# Patient Record
Sex: Female | Born: 1970 | Race: Black or African American | Hispanic: No | Marital: Single | Smoking: Never smoker
Health system: Southern US, Community
[De-identification: ages and names within clinical notes are randomized; demographics above are authoritative.]

## PROBLEM LIST (undated history)

## (undated) DIAGNOSIS — E119 Type 2 diabetes mellitus without complications: Secondary | ICD-10-CM

## (undated) DIAGNOSIS — I10 Essential (primary) hypertension: Secondary | ICD-10-CM

## (undated) DIAGNOSIS — R011 Cardiac murmur, unspecified: Secondary | ICD-10-CM

## (undated) DIAGNOSIS — E669 Obesity, unspecified: Secondary | ICD-10-CM

## (undated) DIAGNOSIS — I214 Non-ST elevation (NSTEMI) myocardial infarction: Principal | ICD-10-CM

## (undated) DIAGNOSIS — G43909 Migraine, unspecified, not intractable, without status migrainosus: Secondary | ICD-10-CM

## (undated) DIAGNOSIS — J45909 Unspecified asthma, uncomplicated: Secondary | ICD-10-CM

## (undated) HISTORY — DX: Non-ST elevation (NSTEMI) myocardial infarction: I21.4

## (undated) HISTORY — DX: Obesity, unspecified: E66.9

## (undated) HISTORY — PX: TUBAL LIGATION: SHX77

## (undated) HISTORY — DX: Migraine, unspecified, not intractable, without status migrainosus: G43.909

---
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
## 1990-03-13 HISTORY — PX: OTHER SURGICAL HISTORY: SHX169

## 2007-03-14 HISTORY — PX: ABSCESS DRAINAGE: SHX1119

## 2007-03-14 HISTORY — PX: ABDOMINAL HYSTERECTOMY: SHX81

## 2012-07-04 ENCOUNTER — Emergency Department (HOSPITAL_COMMUNITY)
Admission: EM | Admit: 2012-07-04 | Discharge: 2012-07-04 | Disposition: A | Discharge status: Home / Self Care | Payer: Self-pay | Attending: Emergency Medicine | Admitting: Emergency Medicine

## 2012-07-04 ENCOUNTER — Encounter (HOSPITAL_COMMUNITY): Payer: Self-pay | Admitting: *Deleted

## 2012-07-04 DIAGNOSIS — J45909 Unspecified asthma, uncomplicated: Secondary | ICD-10-CM | POA: Insufficient documentation

## 2012-07-04 DIAGNOSIS — Z79899 Other long term (current) drug therapy: Secondary | ICD-10-CM | POA: Insufficient documentation

## 2012-07-04 DIAGNOSIS — R209 Unspecified disturbances of skin sensation: Secondary | ICD-10-CM | POA: Insufficient documentation

## 2012-07-04 DIAGNOSIS — I10 Essential (primary) hypertension: Secondary | ICD-10-CM | POA: Insufficient documentation

## 2012-07-04 DIAGNOSIS — M5410 Radiculopathy, site unspecified: Secondary | ICD-10-CM

## 2012-07-04 DIAGNOSIS — Z7982 Long term (current) use of aspirin: Secondary | ICD-10-CM | POA: Insufficient documentation

## 2012-07-04 DIAGNOSIS — IMO0002 Reserved for concepts with insufficient information to code with codable children: Secondary | ICD-10-CM | POA: Insufficient documentation

## 2012-07-04 DIAGNOSIS — E119 Type 2 diabetes mellitus without complications: Secondary | ICD-10-CM | POA: Insufficient documentation

## 2012-07-04 HISTORY — DX: Type 2 diabetes mellitus without complications: E11.9

## 2012-07-04 HISTORY — DX: Unspecified asthma, uncomplicated: J45.909

## 2012-07-04 HISTORY — DX: Essential (primary) hypertension: I10

## 2012-07-04 MED ORDER — DIAZEPAM 5 MG PO TABS: 5.0000 mg | ORAL_TABLET | Freq: Three times a day (TID) | ORAL | Status: DC | PRN

## 2012-07-04 MED ORDER — HYDROCODONE-ACETAMINOPHEN 5-325 MG PO TABS: 1.0000 | ORAL_TABLET | ORAL | Status: DC | PRN

## 2012-07-04 MED ORDER — HYDROCODONE-ACETAMINOPHEN 5-325 MG PO TABS
1.0000 | ORAL_TABLET | Freq: Once | ORAL | Status: AC
  Administered 2012-07-04: 1 via ORAL
  Filled 2012-07-04: qty 1

## 2012-07-04 NOTE — ED Notes (Signed)
The pt has had lower back pain for 2 weeks.  No known injury. No previous history

## 2012-07-04 NOTE — ED Provider Notes (Signed)
History    This chart was scribed for non-physician practitioner working with Carleene Cooper III, MD by Donne Anon, ED Scribe. This patient was seen in room TR05C/TR05C and the patient's care was started at 1933.   CSN: [id number]  Arrival date & time 07/04/12  1836   First MD Initiated Contact with Patient 07/04/12 1933      Chief Complaint  Patient presents with  . Back Pain     The history is provided by the patient. No language interpreter was used.   Margaret Meyers is a 42 y.o. female who presents to the Emergency Department complaining of gradual onset, constant, gradually worsening lower back pain described as aching which radiates into her buttock and left leg and began 2 weeks ago. She denies any recent injury to the area, falls, or MVCs. Pt reports associated occasional numbness. She states the pain is worse with movement and certain motions. She states she feels if someone pulls and stretches her leg she thinks it will relieve the pain. She has tried robaxin once with no relief.  Pt denies weakness, vaginal discharge, dysuria, bowel or bladder, incontinence, hematuria, abdominal pain, fever, chills, body aches or any other pain.   She has had a hysterectomy. She denies IV drug use. No hx cancer. She does not have a PCP.   Past Medical History  Diagnosis Date  . Diabetes mellitus without complication   . Hypertension   . Asthma     History reviewed. No pertinent past surgical history.  No family history on file.  History  Substance Use Topics  . Smoking status: Never Smoker   . Smokeless tobacco: Not on file  . Alcohol Use: Yes     Review of Systems  Constitutional: Negative for fever and chills.  Gastrointestinal: Negative for nausea, vomiting, abdominal pain and diarrhea.  Genitourinary: Negative for dysuria, hematuria, vaginal bleeding, vaginal discharge and vaginal pain.  Musculoskeletal: Positive for back pain.  Neurological: Positive for numbness.  All  other systems reviewed and are negative.    Allergies  Penicillins  Home Medications   Current Outpatient Rx  Name  Route  Sig  Dispense  Refill  . albuterol (PROVENTIL HFA;VENTOLIN HFA) 108 (90 BASE) MCG/ACT inhaler   Inhalation   Inhale 2 puffs into the lungs every 6 (six) hours as needed for wheezing. FOR WHEEZING         . amLODipine (NORVASC) 5 MG tablet   Oral   Take 5 mg by mouth daily.         Marland Kitchen aspirin 325 MG tablet   Oral   Take 325 mg by mouth daily.         . beclomethasone (QVAR) 40 MCG/ACT inhaler   Inhalation   Inhale 1 puff into the lungs 2 (two) times daily.         . cyclobenzaprine (FLEXERIL) 10 MG tablet   Oral   Take 10 mg by mouth daily as needed for muscle spasms. For pain         . hydrochlorothiazide (HYDRODIURIL) 12.5 MG tablet   Oral   Take 12.5 mg by mouth daily.         Marland Kitchen lisinopril (PRINIVIL,ZESTRIL) 20 MG tablet   Oral   Take 20 mg by mouth daily.         . metFORMIN (GLUCOPHAGE) 500 MG tablet   Oral   Take 500 mg by mouth 4 (four) times daily.         Marland Kitchen  metoprolol tartrate (LOPRESSOR) 25 MG tablet   Oral   Take 25 mg by mouth daily.           BP 161/99  Pulse 109  Temp(Src) 97.8 F (36.6 C) (Oral)  Resp 16  SpO2 99%  Physical Exam  Nursing note and vitals reviewed. Constitutional: She appears well-developed and well-nourished. No distress.  HENT:  Head: Normocephalic and atraumatic.  Neck: Neck supple.  Pulmonary/Chest: Effort normal.  Abdominal: Soft. She exhibits no distension. There is no tenderness. There is no rebound and no guarding.  Musculoskeletal: Normal range of motion. She exhibits no edema.       Back:  No tenderness, crepitance or step offs along spine.   Lower extremities:  Strength 5/5, sensation intact, distal pulses intact.       Neurological: She is alert.  Skin: She is not diaphoretic.    ED Course  Procedures (including critical care time) DIAGNOSTIC STUDIES: Oxygen  Saturation is 99% on room air, normal by my interpretation.    COORDINATION OF CARE: 8:23 PM Discussed treatment plan which includes medication with pt at bedside and pt agreed to plan. Will provide with a PCP resource guide.    Labs Reviewed - No data to display No results found.   1. Radicular low back pain     MDM  Pt with two weeks of left lower back pain with radiation into lower leg.  Neurovascularly intact.  No red flags for back pain.  No injury or fall - no need for imaging at this time.  Pt d/c home with short course of valium, norco, PCP follow up.  Discussed all results with patient.  Pt given return precautions.  Pt verbalizes understanding and agrees with plan.      I personally performed the services described in this documentation, which was scribed in my presence. The recorded information has been reviewed and is accurate.        Trixie Dredge, PA-C 07/04/12 2044

## 2012-07-05 NOTE — ED Provider Notes (Signed)
Medical screening examination/treatment/procedure(s) were performed by non-physician practitioner and as supervising physician I was immediately available for consultation/collaboration.   Carleene Cooper III, MD 07/05/12 [phone]

## 2012-11-29 ENCOUNTER — Emergency Department (HOSPITAL_COMMUNITY): Payer: Self-pay

## 2012-11-29 ENCOUNTER — Emergency Department (HOSPITAL_COMMUNITY)
Admission: EM | Admit: 2012-11-29 | Discharge: 2012-11-29 | Disposition: A | Discharge status: Home / Self Care | Payer: Self-pay | Attending: Emergency Medicine | Admitting: Emergency Medicine

## 2012-11-29 ENCOUNTER — Encounter (HOSPITAL_COMMUNITY): Payer: Self-pay | Admitting: *Deleted

## 2012-11-29 DIAGNOSIS — Z7982 Long term (current) use of aspirin: Secondary | ICD-10-CM | POA: Insufficient documentation

## 2012-11-29 DIAGNOSIS — R079 Chest pain, unspecified: Secondary | ICD-10-CM

## 2012-11-29 DIAGNOSIS — R61 Generalized hyperhidrosis: Secondary | ICD-10-CM | POA: Insufficient documentation

## 2012-11-29 DIAGNOSIS — Z880 Allergy status to penicillin: Secondary | ICD-10-CM | POA: Insufficient documentation

## 2012-11-29 DIAGNOSIS — IMO0002 Reserved for concepts with insufficient information to code with codable children: Secondary | ICD-10-CM | POA: Insufficient documentation

## 2012-11-29 DIAGNOSIS — Z79899 Other long term (current) drug therapy: Secondary | ICD-10-CM | POA: Insufficient documentation

## 2012-11-29 DIAGNOSIS — E119 Type 2 diabetes mellitus without complications: Secondary | ICD-10-CM | POA: Insufficient documentation

## 2012-11-29 DIAGNOSIS — R071 Chest pain on breathing: Secondary | ICD-10-CM | POA: Insufficient documentation

## 2012-11-29 DIAGNOSIS — J45901 Unspecified asthma with (acute) exacerbation: Secondary | ICD-10-CM | POA: Insufficient documentation

## 2012-11-29 DIAGNOSIS — E669 Obesity, unspecified: Secondary | ICD-10-CM | POA: Insufficient documentation

## 2012-11-29 DIAGNOSIS — R51 Headache: Secondary | ICD-10-CM | POA: Insufficient documentation

## 2012-11-29 DIAGNOSIS — I10 Essential (primary) hypertension: Secondary | ICD-10-CM | POA: Insufficient documentation

## 2012-11-29 DIAGNOSIS — R011 Cardiac murmur, unspecified: Secondary | ICD-10-CM | POA: Insufficient documentation

## 2012-11-29 HISTORY — DX: Cardiac murmur, unspecified: R01.1

## 2012-11-29 LAB — CBC WITH DIFFERENTIAL/PLATELET
Basophils Absolute: 0 10*3/uL (ref 0.0–0.1)
Basophils Relative: 0 % (ref 0–1)
Eosinophils Absolute: 0 10*3/uL (ref 0.0–0.7)
MCH: 26.8 pg (ref 26.0–34.0)
MCHC: 32.6 g/dL (ref 30.0–36.0)
Monocytes Relative: 1 % — ABNORMAL LOW (ref 3–12)
Neutro Abs: 8.3 10*3/uL — ABNORMAL HIGH (ref 1.7–7.7)
Neutrophils Relative %: 85 % — ABNORMAL HIGH (ref 43–77)
Platelets: 264 10*3/uL (ref 150–400)
RDW: 14.6 % (ref 11.5–15.5)

## 2012-11-29 LAB — BASIC METABOLIC PANEL
BUN: 17 mg/dL (ref 6–23)
Calcium: 9.2 mg/dL (ref 8.4–10.5)
Creatinine, Ser: 0.79 mg/dL (ref 0.50–1.10)
GFR calc Af Amer: >90 mL/min (ref >90)
GFR calc non Af Amer: >90 mL/min (ref >90)
Glucose, Bld: 495 mg/dL — ABNORMAL HIGH (ref 70–99)
Potassium: 4.5 mEq/L (ref 3.5–5.1)

## 2012-11-29 LAB — TROPONIN I: Troponin I: <0.3 ng/mL (ref <0.30)

## 2012-11-29 NOTE — ED Notes (Signed)
Pt given update on labs and x-rays

## 2012-11-29 NOTE — Discharge Instructions (Signed)
Chest Pain (Nonspecific) Chest pain has many causes. Your pain could be caused by something serious, such as a heart attack or a blood clot in the lungs. It could also be caused by something less serious, such as a chest bruise or a virus. Follow up with your doctor. More lab tests or other studies may be needed to find the cause of your pain. Most of the time, nonspecific chest pain will improve within 2 to 3 days of rest and mild pain medicine. HOME CARE  For chest bruises, you may put ice on the sore area for 15-20 minutes, 3-4 times a day. Do this only if it makes you or your child feel better.  Put ice in a plastic bag.  Place a towel between the skin and the bag.  Rest for the next 2 to 3 days.  Go back to work if the pain improves.  See your doctor if the pain lasts longer than 1 to 2 weeks.  Only take medicine as told by your doctor.  Quit smoking if you smoke. GET HELP RIGHT AWAY IF:   There is more pain or pain that spreads to the arm, neck, jaw, back, or belly (abdomen).  You or your child has shortness of breath.  You or your child coughs more than usual or coughs up blood.  You or your child has very bad back or belly pain, feels sick to his or her stomach (nauseous), or throws up (vomits).  You or your child has very bad weakness.  You or your child passes out (faints).  You or your child has a temperature by mouth above 102 F (38.9 C), not controlled by medicine. Any of these problems may be serious and may be an emergency. Do not wait to see if the problems will go away. Get medical help right away. Call your local emergency services 911 in [place] Do not drive yourself to the hospital. MAKE SURE YOU:   Understand these instructions.  Will watch this condition.  Will get help right away if you or your child is not doing well or gets worse. Document Released: 08/16/2007 Document Revised: 05/22/2011 Document Reviewed: 08/16/2007 [facility] Patient Information  2014 [place], [place].   RESOURCE GUIDE  Chronic Pain Problems: Contact [facility]  [phone] Patients need to be referred by their primary care doctor.  Insufficient Money for Medicine: Contact United Way:  call [phone]  No Primary Care Doctor: - Call Health Connect  [phone] - can help you locate a primary care doctor that  accepts your insurance, provides certain services, etc. - Physician Referral [phone]  Agencies that provide inexpensive medical care: - [facility]  [phone] - [facility]  [phone] - [facility]  [phone] - Women's Clinic  [phone] - Planned Parenthood  [phone] [facility]  [phone]  Medicaid-accepting [facility] Providers: - [facility]- [address]  [phone], Mon-Fri 9am-7pm, Sat 9am-1pm - [facility]- [address] [place], [address] [place]  [phone] - [facility]- [address] [place]  [phone] [facility]- [address]  [phone] - Renaye Rakers- [address] [place], [address], [phone]  Only accepts [place] patients after they have their name  applied to their card  Self Pay (no insurance) in [place]: - Sickle Cell Patients - [facility]  [address]  [place], [phone] - [facility]- [address] [place]  [phone]       Patrcia Dolly [facility] [place]- [address]       -     [facility]- see information above (Speak to [employer] if you do not have insurance)       -  [facility]- [address],  [phone]       -  [facility]- [address] [phone]       -  Dr Julio Sicks-  [address], [place], [phone]       -  [facility] - [address] [phone]       -  [facility]- [address] [phone], also [address] [phone]       -     [facility]- [address] [place], [phone], 1st & 3rd Saturday         every month, 10am-1pm  -     [facility]   [address] [place], [place].   Phone:  [phone], Fax:  [phone]. Hours of Operation:  9 am - 6 pm, M-F.  -     [facility]   [address], [place]   Phone: [phone], Fax: [phone]. Hours of Operation:  8:30 am - 5:30 pm, M-F.  [facility] [address] [place], [place] [address] [phone]  [facility] [address] [place], [place] [address] [phone]  1) Find a Doctor and Pay Out of Pocket Although you won't have to find out who is covered by your insurance plan, it is a good idea to ask around and get recommendations. You will then need to call the office and see if the doctor you have chosen will accept you as a new patient and what types of options they offer for patients who are self-pay. Some doctors offer discounts or will set up payment plans for their patients who do not have insurance, but you will need to ask so you aren't surprised when you get to your appointment.  2) Contact Your Local Health Department Not all health departments have doctors that can see patients for sick visits, but many do, so it is worth a call to see if yours does. If you don't know where your local health department is, you can check in your phone book. The CDC also has a tool to help you locate your state's health department, and many state websites also have listings of all of their local health departments.  3) Find a Walk-in Clinic If your illness is not likely to be very severe or complicated, you may want to try a walk in clinic. These are popping up all over the country in pharmacies, drugstores, and shopping centers. They're usually staffed by nurse practitioners or physician  assistants that have been trained to treat common illnesses and complaints. They're usually fairly quick and inexpensive. However, if you have serious medical issues or chronic medical problems, these are probably not your best option  STD Testing - [facility] Department of [facility] [place], STD Clinic, [address] [place], phone [phone] or [phone].  Monday - Friday, call for an appointment. [facility] of [employer], STD Clinic,  [address], [place], phone [phone] or [phone].  Monday - Friday, call for an appointment.  Abuse/Neglect: [facility] Child Abuse Hotline 716-415-9620 [facility] Child Abuse Hotline [phone] (After Hours)  Emergency Shelter:  [facility] [phone]  Maternity Homes: - Room at the [place] [facility] [phone] - [facility] [phone]  MRSA Hotline #:   9386632106  Dental Assistance If unable to pay or uninsured, contact:  [facility]. to become qualified for the adult dental clinic.  Patients with Medicaid: [facility] [phone] [address], [phone] [address], [phone]  If unable to pay, or uninsured, contact [facility] [phone] in [place], [phone] in [facility]) to become qualified for the adult dental clinic  [facility] [address] [place], [place] [address] [phone] [email or web address]  Other Proofreader Services: - [facility]- [address] [place], [place], [place], [address], [phone], Ext. 123, 2nd and 4th Thursday of the month at 6:30am.  10 clients each day by appointment, can sometimes see walk-in patients if someone does not show for an appointment. [facility]- [address] [place], [place], [address], [phone] - [facility] [address] [place], [place],  [address], [phone] - [facility]- [phone] [facility] Health Department- [phone] [facility] Health [phone]       Behavioral Health Resources in the [facility]  Intensive Outpatient Programs: [facility]      [address] [place], [place] [phone] Both a day and evening program       [facility]     [address]        [place], [place] [address] [phone]         ADS: [facility] [address] [place] [phone]  [facility] Mental Health ACCESS LINE: ([phone] or [phone] [address] [place], [place] [address] [email or web address]   Substance Abuse Resources: - [facility] Drug [facility]  ([phone] - [facility] [phone] - [facility] [phone] Floydene Flock [phone] - Residential & Outpatient Substance Abuse Program  [phone]  Psychological Services: [facility]  [phone] [facility]  [phone] - [facility], [phone] [place]. [address], [place], ACCESS LINE: [phone]) [phone] or 4310857381, [email or web address]  Mobile Crisis Teams:                                        [facility]rapeutic Alternatives         Mobile Crisis Care Unit [phone]             [facility] [address] Ginette Otto [phone]                                         Interventionist [address] [place] [address] [place] [phone]  Self-Help/Support Groups: Mental Health Assoc. of [employer] of support groups 765-677-9170 (call for more info)  Narcotics Anonymous (NA) Caring Services [address] [place] - 2 meetings at this location  Residential [employer]:  [facility] Residential Treatment      [phone] [employer]  [place]  [place]       [phone]         [facility] [address], [place] [address] [place], [place]  [address] [phone]  [facility]  [address] [place], [place] [address] [phone] Admissions: 8am-3pm M-F  [facility]     [address]        [place], [place] [address]       [phone]         [facility] [address] Starling Manns [place], [place] [phone]  [facility] [address] [place], [place] [phone]  [facility] - Intensive Outpatient      [address]     [place], [place]       [phone]         [facility] (Addiction Recovery Care [facility].)     [address] [place], [place] [phone] or [phone]  Residential Treatment Services (RTS), Medicaid [address] [place], [place] [phone]  [facility]ellowship [address]                                               [address] [place] [phone]  [facility] Resources: [phone]               General Therapy                                                Angie Fava, PhD        [address] [place], [place] [address]         [phone]   Insurance  [facility] Behavioral   [address] [place], [place] [address] [phone]  [facility] Recovery [address] [place], [place] [address] [phone] Insurance/Medicaid/sponsorship through [facility] and Families                                              [address]                                        [place], [place] [address]    Therapy/tele-psych/case         [phone]          [facility] [address], [place]  [address]  Adolescent/group home/case management [phone]                                           Creola Corn PhD       General therapy       Insurance   [phone]  Dr. Lolly Mustache, Insurance, M-F [phone]  [facility]  [employer] [facility] Dept. [address]                 [address]         [address] [place] [address]  [place]ondell Reveal Phone:  [phone]                                  Phone:  [phone]                   Phone:  [phone]  [facility], [phone] - [facility] - [facility]- [phone]       -     [facility] in [place], [address]             [phone], Insurance  [place] Child Abuse Hotline [phone] or [phone] (After Hours)   You will need a primary care doctor and an endocrinologist for your diabetes Your glucose is elevated today.  Return if worse

## 2012-11-29 NOTE — ED Notes (Addendum)
Chest pain , onset this am and headache.   Sob.  Increased pain with inspiration

## 2012-11-29 NOTE — ED Provider Notes (Signed)
CSN: [id number]     Arrival date & time 11/29/12  1529 History  This chart was scribed for Donnetta Hutching, MD by Shari Heritage, ED Scribe. The patient was seen in room APA15/APA15. Patient's care was started at 4:08 PM.     Chief Complaint  Patient presents with  . Chest Pain    The history is provided by the patient. No language interpreter was used.    HPI Comments: Margaret Meyers is a 42 y.o. female with history of heart murmur, cardiomegaly, DM, HTN and asthma who presents to the Emergency Department complaining of constant, moderate anterior central chest pain onset 6 hours ago. She describes pain as "pulling." She states that pain began while she was sitting down at work. There is associated diaphoresis, shortness of breath, and severe headache. She states that she checked her blood sugar after her symptoms began and it was 478. She denies any other symptoms at this time. She does not smoke cigarettes.   Past Medical History  Diagnosis Date  . Diabetes mellitus without complication   . Hypertension   . Asthma   . Heart murmur    Past Surgical History  Procedure Laterality Date  . Tubal ligation    . Abdominal hysterectomy     History reviewed. No pertinent family history. History  Substance Use Topics  . Smoking status: Never Smoker   . Smokeless tobacco: Not on file  . Alcohol Use: Yes   OB History   Grav Para Term Preterm Abortions TAB SAB Ect Mult Living                 Review of Systems A complete 10 system review of systems was obtained and all systems are negative except as noted in the HPI and PMH.   Allergies  Penicillins  Home Medications   Current Outpatient Rx  Name  Route  Sig  Dispense  Refill  . albuterol (PROVENTIL HFA;VENTOLIN HFA) 108 (90 BASE) MCG/ACT inhaler   Inhalation   Inhale 2 puffs into the lungs every 6 (six) hours as needed for wheezing. FOR WHEEZING         . amLODipine (NORVASC) 5 MG tablet   Oral   Take 5 mg by mouth daily.          Marland Kitchen aspirin 325 MG tablet   Oral   Take 325 mg by mouth daily.         . beclomethasone (QVAR) 40 MCG/ACT inhaler   Inhalation   Inhale 1 puff into the lungs 2 (two) times daily.         . cyclobenzaprine (FLEXERIL) 10 MG tablet   Oral   Take 10 mg by mouth daily as needed for muscle spasms. For pain         . diazepam (VALIUM) 5 MG tablet   Oral   Take 1 tablet (5 mg total) by mouth every 8 (eight) hours as needed (muscle spasm or pain).   10 tablet   0   . hydrochlorothiazide (HYDRODIURIL) 12.5 MG tablet   Oral   Take 12.5 mg by mouth daily.         Marland Kitchen HYDROcodone-acetaminophen (NORCO/VICODIN) 5-325 MG per tablet   Oral   Take 1 tablet by mouth every 4 (four) hours as needed for pain.   10 tablet   0   . lisinopril (PRINIVIL,ZESTRIL) 20 MG tablet   Oral   Take 20 mg by mouth daily.         Marland Kitchen  metFORMIN (GLUCOPHAGE) 500 MG tablet   Oral   Take 500 mg by mouth 4 (four) times daily.         . metoprolol tartrate (LOPRESSOR) 25 MG tablet   Oral   Take 25 mg by mouth daily.          Triage Vitals: BP 153/110  Pulse 111  Temp(Src) 98.4 F (36.9 C) (Oral)  Resp 20  Ht 4\' 8"  (1.422 m)  Wt 210 lb (95.255 kg)  BMI 47.11 kg/m2  SpO2 97% Physical Exam  Nursing note and vitals reviewed. Constitutional: She is oriented to person, place, and time. She appears well-developed and well-nourished.  Obese.  HENT:  Head: Normocephalic and atraumatic.  Eyes: Conjunctivae and EOM are normal. Pupils are equal, round, and reactive to light.  Neck: Normal range of motion. Neck supple.  Cardiovascular: Normal rate, regular rhythm and normal heart sounds.   Pulmonary/Chest: Effort normal and breath sounds normal.  Mild tenderness to left anterior chest wall.  Abdominal: Soft. Bowel sounds are normal.  Musculoskeletal: Normal range of motion.  Neurological: She is alert and oriented to person, place, and time.  Skin: Skin is warm and dry.  Psychiatric: She has a  normal mood and affect.    ED Course  Procedures (including critical care time) DIAGNOSTIC STUDIES: Oxygen Saturation is 97% on room air, adequate by my interpretation.    COORDINATION OF CARE: 4:10 PM-Will administer nitroglycerin, and order EKG, CBC with diff, troponin and BMP. Patient informed of current plan for treatment and evaluation and agrees with plan at this time.     Labs Review Labs Reviewed  BASIC METABOLIC PANEL - Abnormal; Notable for the following:    Sodium 131 (*)    Chloride 93 (*)    Glucose, Bld 495 (*)    All other components within normal limits  CBC WITH DIFFERENTIAL - Abnormal; Notable for the following:    Neutrophils Relative % 85 (*)    Neutro Abs 8.3 (*)    Monocytes Relative 1 (*)    All other components within normal limits  TROPONIN I    Imaging Review Dg Chest 2 View  11/29/2012   CLINICAL DATA:  Chest pain, shortness of breath  EXAM: CHEST  2 VIEW  COMPARISON:  None.  FINDINGS: Lungs are clear. No pleural effusion or pneumothorax.  Mild cardiomegaly.  Visualized osseous structures are within normal limits.  IMPRESSION: No evidence of acute cardiopulmonary disease.   Electronically Signed   By: Charline Bills M.D.   On: 11/29/2012 16:52     Date: 11/29/2012  Rate: 101  Rhythm: sinus tachy  QRS Axis: normal  Intervals: normal  ST/T Wave abnormalities: normal  Conduction Disutrbances: none  Narrative Interpretation: unremarkable    MDM  No diagnosis found. Pain.at discharge. Troponin and EKG negative.  Glucose noted to be elevated. Not in DKA  I personally performed the services described in this documentation, which was scribed in my presence. The recorded information has been reviewed and is accurate.    Donnetta Hutching, MD 11/29/12 2012

## 2013-01-08 ENCOUNTER — Encounter (HOSPITAL_COMMUNITY): Payer: Self-pay | Admitting: Emergency Medicine

## 2013-01-08 ENCOUNTER — Emergency Department (HOSPITAL_COMMUNITY)
Admission: EM | Admit: 2013-01-08 | Discharge: 2013-01-08 | Disposition: A | Discharge status: Home / Self Care | Payer: Self-pay | Attending: Emergency Medicine | Admitting: Emergency Medicine

## 2013-01-08 DIAGNOSIS — J029 Acute pharyngitis, unspecified: Secondary | ICD-10-CM | POA: Insufficient documentation

## 2013-01-08 DIAGNOSIS — I10 Essential (primary) hypertension: Secondary | ICD-10-CM | POA: Insufficient documentation

## 2013-01-08 DIAGNOSIS — Z7982 Long term (current) use of aspirin: Secondary | ICD-10-CM | POA: Insufficient documentation

## 2013-01-08 DIAGNOSIS — Z79899 Other long term (current) drug therapy: Secondary | ICD-10-CM | POA: Insufficient documentation

## 2013-01-08 DIAGNOSIS — Z880 Allergy status to penicillin: Secondary | ICD-10-CM | POA: Insufficient documentation

## 2013-01-08 DIAGNOSIS — J45909 Unspecified asthma, uncomplicated: Secondary | ICD-10-CM | POA: Insufficient documentation

## 2013-01-08 DIAGNOSIS — R011 Cardiac murmur, unspecified: Secondary | ICD-10-CM | POA: Insufficient documentation

## 2013-01-08 DIAGNOSIS — J069 Acute upper respiratory infection, unspecified: Secondary | ICD-10-CM | POA: Insufficient documentation

## 2013-01-08 DIAGNOSIS — L293 Anogenital pruritus, unspecified: Secondary | ICD-10-CM | POA: Insufficient documentation

## 2013-01-08 DIAGNOSIS — E119 Type 2 diabetes mellitus without complications: Secondary | ICD-10-CM | POA: Insufficient documentation

## 2013-01-08 LAB — CBC WITH DIFFERENTIAL/PLATELET
Basophils Absolute: 0 10*3/uL (ref 0.0–0.1)
Basophils Relative: 0 % (ref 0–1)
Eosinophils Absolute: 0.1 10*3/uL (ref 0.0–0.7)
Eosinophils Relative: 1 % (ref 0–5)
HCT: 39.2 % (ref 36.0–46.0)
Hemoglobin: 13 g/dL (ref 12.0–15.0)
Lymphocytes Relative: 26 % (ref 12–46)
Lymphs Abs: 2.2 10*3/uL (ref 0.7–4.0)
MCH: 27.3 pg (ref 26.0–34.0)
MCHC: 33.2 g/dL (ref 30.0–36.0)
MCV: 82.4 fL (ref 78.0–100.0)
Monocytes Absolute: 0.3 K/uL (ref 0.1–1.0)
Monocytes Relative: 3 % (ref 3–12)
Neutro Abs: 6.2 K/uL (ref 1.7–7.7)
Neutrophils Relative %: 70 % (ref 43–77)
Platelets: 231 10*3/uL (ref 150–400)
RBC: 4.76 MIL/uL (ref 3.87–5.11)
RDW: 14.5 % (ref 11.5–15.5)
WBC: 8.8 10*3/uL (ref 4.0–10.5)

## 2013-01-08 LAB — URINALYSIS, ROUTINE W REFLEX MICROSCOPIC
Bilirubin Urine: NEGATIVE
Glucose, UA: >1000 mg/dL — AB
Hgb urine dipstick: NEGATIVE
Ketones, ur: 15 mg/dL — AB
Leukocytes, UA: NEGATIVE
Nitrite: NEGATIVE
Protein, ur: NEGATIVE mg/dL
Specific Gravity, Urine: 1.036 — ABNORMAL HIGH (ref 1.005–1.030)
Urobilinogen, UA: 1 mg/dL (ref 0.0–1.0)
pH: 6 (ref 5.0–8.0)

## 2013-01-08 LAB — COMPREHENSIVE METABOLIC PANEL WITH GFR
Albumin: 3.2 g/dL — ABNORMAL LOW (ref 3.5–5.2)
BUN: 11 mg/dL (ref 6–23)
Chloride: 97 meq/L (ref 96–112)
Creatinine, Ser: 0.78 mg/dL (ref 0.50–1.10)
GFR calc Af Amer: >90 mL/min (ref >90)
GFR calc non Af Amer: >90 mL/min (ref >90)
Glucose, Bld: 293 mg/dL — ABNORMAL HIGH (ref 70–99)
Total Bilirubin: 0.4 mg/dL (ref 0.3–1.2)

## 2013-01-08 LAB — WET PREP, GENITAL
Clue Cells Wet Prep HPF POC: NONE SEEN
Trich, Wet Prep: NONE SEEN
WBC, Wet Prep HPF POC: NONE SEEN
Yeast Wet Prep HPF POC: NONE SEEN

## 2013-01-08 LAB — COMPREHENSIVE METABOLIC PANEL
ALT: 23 U/L (ref 0–35)
AST: 21 U/L (ref 0–37)
Alkaline Phosphatase: 93 U/L (ref 39–117)
CO2: 26 mEq/L (ref 19–32)
Calcium: 8.5 mg/dL (ref 8.4–10.5)
Potassium: 3.9 mEq/L (ref 3.5–5.1)
Sodium: 134 mEq/L — ABNORMAL LOW (ref 135–145)
Total Protein: 6.8 g/dL (ref 6.0–8.3)

## 2013-01-08 LAB — URINE MICROSCOPIC-ADD ON

## 2013-01-08 MED ORDER — BENZONATATE 100 MG PO CAPS: 100.0000 mg | ORAL_CAPSULE | Freq: Three times a day (TID) | ORAL | Status: DC

## 2013-01-08 MED ORDER — LISINOPRIL 20 MG PO TABS: 20.0000 mg | ORAL_TABLET | Freq: Every day | ORAL | Status: DC

## 2013-01-08 MED ORDER — GLYBURIDE 5 MG PO TABS: 5.0000 mg | ORAL_TABLET | Freq: Every day | ORAL | Status: DC

## 2013-01-08 MED ORDER — METFORMIN HCL 500 MG PO TABS: 1000.0000 mg | ORAL_TABLET | Freq: Two times a day (BID) | ORAL | Status: DC

## 2013-01-08 MED ORDER — METOPROLOL TARTRATE 50 MG PO TABS: 25.0000 mg | ORAL_TABLET | Freq: Two times a day (BID) | ORAL | Status: DC

## 2013-01-08 MED ORDER — HYDROCHLOROTHIAZIDE 25 MG PO TABS: 12.5000 mg | ORAL_TABLET | Freq: Every day | ORAL | Status: DC

## 2013-01-08 MED ORDER — BECLOMETHASONE DIPROPIONATE 40 MCG/ACT IN AERS: 2.0000 | INHALATION_SPRAY | Freq: Two times a day (BID) | RESPIRATORY_TRACT | Status: DC

## 2013-01-08 MED ORDER — FLUCONAZOLE 150 MG PO TABS: 150.0000 mg | ORAL_TABLET | Freq: Once | ORAL | Status: DC

## 2013-01-08 MED ORDER — AMLODIPINE BESYLATE 10 MG PO TABS: 5.0000 mg | ORAL_TABLET | Freq: Every day | ORAL | Status: DC

## 2013-01-08 NOTE — ED Notes (Signed)
Pt reports HA, sore throat, nose bleed at 0200 today, intermittent cough.  Also thinks she has a yeast infection.

## 2013-01-08 NOTE — ED Provider Notes (Signed)
CSN: [id number]     Arrival date & time 01/08/13  1019 History   First MD Initiated Contact with Patient 01/08/13 1153     Chief Complaint  Patient presents with  . Epistaxis   (Consider location/radiation/quality/duration/timing/severity/associated sxs/prior Treatment) HPI Comments: Patient presents with several complaints.  She reports that she has been having nasal congestion, cough, and sore throat for the past 3 days.  Symptoms gradually worsening.  She has not taken anything for symptoms prior to arrival.  She also states that earlier this morning her nose began bleeding, which has occurred intermittently throughout the day.  She denies any epistaxis at this time.  She also is requesting prescriptions for her DM and HTN medications.  She states that she ran out of the medications two weeks ago.  She recently moved from [place] and has not yet established with a PCP.  She is also complaining of some vaginal itching and irritation that has been present for the past week.  She reports that her symptoms are similar to when she has had a yeast infection in the past.  She denies any vaginal discharge.  Denies dysuria, increased urinary urgency, and increased urinary frequency.  No fever or chills.    The history is provided by the patient.    Past Medical History  Diagnosis Date  . Diabetes mellitus without complication   . Hypertension   . Asthma   . Heart murmur    Past Surgical History  Procedure Laterality Date  . Tubal ligation    . Abdominal hysterectomy     No family history on file. History  Substance Use Topics  . Smoking status: Never Smoker   . Smokeless tobacco: Not on file  . Alcohol Use: Yes   OB History   Grav Para Term Preterm Abortions TAB SAB Ect Mult Living                 Review of Systems  Constitutional: Negative for fever and chills.  HENT: Positive for sore throat.   Respiratory: Positive for cough.   Genitourinary:       Vaginal itching and  irritation.  All other systems reviewed and are negative.    Allergies  Penicillins  Home Medications   Current Outpatient Rx  Name  Route  Sig  Dispense  Refill  . albuterol (PROVENTIL HFA;VENTOLIN HFA) 108 (90 BASE) MCG/ACT inhaler   Inhalation   Inhale 2 puffs into the lungs every 6 (six) hours as needed for wheezing. FOR WHEEZING         . amLODipine (NORVASC) 5 MG tablet   Oral   Take 5 mg by mouth every morning.          Marland Kitchen aspirin 325 MG tablet   Oral   Take 325 mg by mouth daily.         . beclomethasone (QVAR) 40 MCG/ACT inhaler   Inhalation   Inhale 1 puff into the lungs 2 (two) times daily.         . flintstones complete (FLINTSTONES) 60 MG chewable tablet   Oral   Chew 2 tablets by mouth every morning.         . glyBURIDE (DIABETA) 5 MG tablet   Oral   Take 5 mg by mouth daily with breakfast.         . hydrochlorothiazide (HYDRODIURIL) 12.5 MG tablet   Oral   Take 12.5 mg by mouth every morning.          Marland Kitchen  lisinopril (PRINIVIL,ZESTRIL) 20 MG tablet   Oral   Take 20 mg by mouth every morning.          . metFORMIN (GLUCOPHAGE) 1000 MG tablet   Oral   Take 1,000 mg by mouth 2 (two) times daily.         . metoprolol tartrate (LOPRESSOR) 25 MG tablet   Oral   Take 25 mg by mouth every morning.           BP 159/97  Pulse 92  Temp(Src) 98.9 F (37.2 C)  Resp 16  SpO2 98% Physical Exam  Nursing note and vitals reviewed. Constitutional: She appears well-developed and well-nourished.  HENT:  Head: Normocephalic and atraumatic.  Right Ear: Tympanic membrane and ear canal normal.  Left Ear: Tympanic membrane and ear canal normal.  Nose: Mucosal edema present. No epistaxis. Right sinus exhibits no maxillary sinus tenderness and no frontal sinus tenderness. Left sinus exhibits no maxillary sinus tenderness and no frontal sinus tenderness.  Mouth/Throat: Uvula is midline, oropharynx is clear and moist and mucous membranes are normal.  No oropharyngeal exudate, posterior oropharyngeal edema or posterior oropharyngeal erythema.  No active epistaxis at this time.  Neck: Normal range of motion. Neck supple.  Cardiovascular: Normal rate, regular rhythm and normal heart sounds.   Pulmonary/Chest: Effort normal and breath sounds normal.  Genitourinary: There is no rash or tenderness on the right labia. There is no rash or tenderness on the left labia.  Thick whitish colored vaginal discharge.    Neurological: She is alert.  Skin: Skin is warm and dry.  Psychiatric: She has a normal mood and affect.    ED Course  Procedures (including critical care time) Labs Review Labs Reviewed  GLUCOSE, CAPILLARY - Abnormal; Notable for the following:    Glucose-Capillary 256 (*)    All other components within normal limits  CBC WITH DIFFERENTIAL  COMPREHENSIVE METABOLIC PANEL  URINALYSIS, ROUTINE W REFLEX MICROSCOPIC   Imaging Review No results found.  EKG Interpretation   None       MDM  No diagnosis found. Patient presents with a chief complaint of nasal congestion, cough, and sore throat for the past 3 days.  Patient afebrile.  Pulse ox 98 on RA.  Lungs CTAB.  Symptoms most consistent with Viral URI.  Patient also complaining of vaginal itching and irritation.  Wet prep negative.  However, patient did have some thick white cottage cheese like discharge of the external vagina on exam.  Will treat for Candida infection with Diflucan.  Patient also reports that she is out of her DM and HTN medications.  Patient given prescriptions and referral to PCP.    Santiago Glad, PA-C 01/08/13 2145

## 2013-01-08 NOTE — ED Notes (Addendum)
Nosebleed since 2 am has had congestion sorethroat  And sever h/a has been out of bp meds and diabetic meds x  2 weeks just moved here she states also has vag itch denies dysuria

## 2013-01-09 LAB — GC/CHLAMYDIA PROBE AMP: CT Probe RNA: NEGATIVE

## 2013-01-14 NOTE — ED Provider Notes (Signed)
Medical screening examination/treatment/procedure(s) were performed by non-physician practitioner and as supervising physician I was immediately available for consultation/collaboration.  EKG Interpretation   None         Gavin Pound. Oletta Lamas, MD 01/14/13 [phone]

## 2014-10-30 ENCOUNTER — Emergency Department (HOSPITAL_COMMUNITY)
Admission: EM | Admit: 2014-10-30 | Discharge: 2014-10-30 | Disposition: A | Discharge status: Home / Self Care | Payer: No Typology Code available for payment source | Attending: Emergency Medicine | Admitting: Emergency Medicine

## 2014-10-30 ENCOUNTER — Encounter (HOSPITAL_COMMUNITY): Payer: Self-pay | Admitting: Emergency Medicine

## 2014-10-30 ENCOUNTER — Emergency Department (HOSPITAL_COMMUNITY): Payer: No Typology Code available for payment source

## 2014-10-30 DIAGNOSIS — K2100 Gastro-esophageal reflux disease with esophagitis, without bleeding: Secondary | ICD-10-CM

## 2014-10-30 DIAGNOSIS — E119 Type 2 diabetes mellitus without complications: Secondary | ICD-10-CM | POA: Insufficient documentation

## 2014-10-30 DIAGNOSIS — R011 Cardiac murmur, unspecified: Secondary | ICD-10-CM | POA: Insufficient documentation

## 2014-10-30 DIAGNOSIS — Z79899 Other long term (current) drug therapy: Secondary | ICD-10-CM | POA: Insufficient documentation

## 2014-10-30 DIAGNOSIS — R079 Chest pain, unspecified: Secondary | ICD-10-CM | POA: Diagnosis present

## 2014-10-30 DIAGNOSIS — R51 Headache: Secondary | ICD-10-CM | POA: Insufficient documentation

## 2014-10-30 DIAGNOSIS — I10 Essential (primary) hypertension: Secondary | ICD-10-CM | POA: Insufficient documentation

## 2014-10-30 DIAGNOSIS — J45909 Unspecified asthma, uncomplicated: Secondary | ICD-10-CM | POA: Diagnosis not present

## 2014-10-30 DIAGNOSIS — Z7982 Long term (current) use of aspirin: Secondary | ICD-10-CM | POA: Diagnosis not present

## 2014-10-30 DIAGNOSIS — Z880 Allergy status to penicillin: Secondary | ICD-10-CM | POA: Insufficient documentation

## 2014-10-30 DIAGNOSIS — Z7951 Long term (current) use of inhaled steroids: Secondary | ICD-10-CM | POA: Insufficient documentation

## 2014-10-30 LAB — BASIC METABOLIC PANEL
Anion gap: 8 (ref 5–15)
BUN: 15 mg/dL (ref 6–20)
CHLORIDE: 100 mmol/L — AB (ref 101–111)
CO2: 31 mmol/L (ref 22–32)
CREATININE: 0.7 mg/dL (ref 0.44–1.00)
Calcium: 8.7 mg/dL — ABNORMAL LOW (ref 8.9–10.3)
Glucose, Bld: 375 mg/dL — ABNORMAL HIGH (ref 65–99)
Potassium: 4.1 mmol/L (ref 3.5–5.1)
SODIUM: 139 mmol/L (ref 135–145)

## 2014-10-30 LAB — HEPATIC FUNCTION PANEL
ALT: 20 U/L (ref 14–54)
AST: 23 U/L (ref 15–41)
Albumin: 3.5 g/dL (ref 3.5–5.0)
Alkaline Phosphatase: 88 U/L (ref 38–126)
BILIRUBIN DIRECT: 0.1 mg/dL (ref 0.1–0.5)
Indirect Bilirubin: 0.3 mg/dL (ref 0.3–0.9)
Total Bilirubin: 0.4 mg/dL (ref 0.3–1.2)
Total Protein: 7.1 g/dL (ref 6.5–8.1)

## 2014-10-30 LAB — CBC
HCT: 42.3 % (ref 36.0–46.0)
Hemoglobin: 13.4 g/dL (ref 12.0–15.0)
MCH: 26.1 pg (ref 26.0–34.0)
MCHC: 31.7 g/dL (ref 30.0–36.0)
MCV: 82.3 fL (ref 78.0–100.0)
PLATELETS: 272 10*3/uL (ref 150–400)
RBC: 5.14 MIL/uL — AB (ref 3.87–5.11)
RDW: 14.2 % (ref 11.5–15.5)
WBC: 7.4 10*3/uL (ref 4.0–10.5)

## 2014-10-30 LAB — TROPONIN I

## 2014-10-30 LAB — LIPASE, BLOOD: Lipase: 42 U/L (ref 22–51)

## 2014-10-30 LAB — D-DIMER, QUANTITATIVE: D-Dimer, Quant: <0.27 ug/mL-FEU (ref 0.00–0.48)

## 2014-10-30 MED ORDER — GI COCKTAIL ~~LOC~~
30.0000 mL | Freq: Once | ORAL | Status: AC
  Administered 2014-10-30: 30 mL via ORAL
  Filled 2014-10-30: qty 30

## 2014-10-30 MED ORDER — FAMOTIDINE 20 MG PO TABS
40.0000 mg | ORAL_TABLET | Freq: Once | ORAL | Status: AC
  Administered 2014-10-30: 40 mg via ORAL
  Filled 2014-10-30: qty 2

## 2014-10-30 MED ORDER — PANTOPRAZOLE SODIUM 20 MG PO TBEC
20.0000 mg | DELAYED_RELEASE_TABLET | Freq: Every day | ORAL | Status: DC
Start: 2014-10-30 — End: 2014-11-23

## 2014-10-30 MED ORDER — ACETAMINOPHEN 325 MG PO TABS: 650.0000 mg | ORAL_TABLET | Freq: Once | ORAL | Status: DC

## 2014-10-30 MED ORDER — SUCRALFATE 1 G PO TABS: 1.0000 g | ORAL_TABLET | Freq: Four times a day (QID) | ORAL | Status: DC

## 2014-10-30 NOTE — ED Provider Notes (Signed)
CSN: [id number]     Arrival date & time 10/30/14  1452 History   First MD Initiated Contact with Patient 10/30/14 1550     Chief Complaint  Patient presents with  . Chest Pain  . lump on neck   . Headache     (Consider location/radiation/quality/duration/timing/severity/associated sxs/prior Treatment) HPI Comments: Per pt, neg stress test 2 years ago--no prior h/o same No uri sx No leg pain or swelling  Patient is a 44 y.o. female presenting with chest pain and headaches. The history is provided by the patient.  Chest Pain Pain location:  Substernal area Pain quality: dull   Pain radiates to:  Does not radiate Pain radiates to the back: no   Pain severity:  Mild Onset quality:  Sudden Duration:  1 day Timing:  Constant Progression:  Worsening Chronicity:  New Context: not breathing   Relieved by:  Nothing Worsened by:  Nothing tried Ineffective treatments:  None tried Associated symptoms: headache   Associated symptoms: no cough, no diaphoresis, no fever, not vomiting and no weakness   Headache Associated symptoms: no cough, no fever, no vomiting and no weakness     Past Medical History  Diagnosis Date  . Diabetes mellitus without complication   . Hypertension   . Asthma   . Heart murmur    Past Surgical History  Procedure Laterality Date  . Tubal ligation    . Abdominal hysterectomy     No family history on file. Social History  Substance Use Topics  . Smoking status: Never Smoker   . Smokeless tobacco: None  . Alcohol Use: Yes   OB History    No data available     Review of Systems  Constitutional: Negative for fever and diaphoresis.  Respiratory: Negative for cough.   Cardiovascular: Positive for chest pain.  Gastrointestinal: Negative for vomiting.  Neurological: Positive for headaches. Negative for weakness.  All other systems reviewed and are negative.     Allergies  Penicillins  Home Medications   Prior to Admission medications    Medication Sig Start Date End Date Taking? Authorizing Provider  albuterol (PROVENTIL HFA;VENTOLIN HFA) 108 (90 BASE) MCG/ACT inhaler Inhale 2 puffs into the lungs every 6 (six) hours as needed for wheezing. FOR WHEEZING    Historical Provider, MD  amLODipine (NORVASC) 10 MG tablet Take 0.5 tablets (5 mg total) by mouth daily. 01/08/13   Heather Laisure, PA-C  amLODipine (NORVASC) 5 MG tablet Take 5 mg by mouth every morning.     Historical Provider, MD  aspirin 325 MG tablet Take 325 mg by mouth daily.    Historical Provider, MD  beclomethasone (QVAR) 40 MCG/ACT inhaler Inhale 1 puff into the lungs 2 (two) times daily.    Historical Provider, MD  beclomethasone (QVAR) 40 MCG/ACT inhaler Inhale 2 puffs into the lungs 2 (two) times daily. 01/08/13   Heather Laisure, PA-C  benzonatate (TESSALON) 100 MG capsule Take 1 capsule (100 mg total) by mouth every 8 (eight) hours. 01/08/13   Heather Laisure, PA-C  flintstones complete (FLINTSTONES) 60 MG chewable tablet Chew 2 tablets by mouth every morning.    Historical Provider, MD  fluconazole (DIFLUCAN) 150 MG tablet Take 1 tablet (150 mg total) by mouth once. 01/08/13   Heather Laisure, PA-C  glyBURIDE (DIABETA) 5 MG tablet Take 5 mg by mouth daily with breakfast.    Historical Provider, MD  glyBURIDE (DIABETA) 5 MG tablet Take 1 tablet (5 mg total) by mouth daily with breakfast. 01/08/13  Heather Laisure, PA-C  hydrochlorothiazide (HYDRODIURIL) 12.5 MG tablet Take 12.5 mg by mouth every morning.     Historical Provider, MD  hydrochlorothiazide (HYDRODIURIL) 25 MG tablet Take 0.5 tablets (12.5 mg total) by mouth daily. 01/08/13   Heather Laisure, PA-C  lisinopril (PRINIVIL,ZESTRIL) 20 MG tablet Take 20 mg by mouth every morning.     Historical Provider, MD  lisinopril (PRINIVIL,ZESTRIL) 20 MG tablet Take 1 tablet (20 mg total) by mouth daily. 01/08/13   Heather Laisure, PA-C  metFORMIN (GLUCOPHAGE) 1000 MG tablet Take 1,000 mg by mouth 2 (two) times  daily.    Historical Provider, MD  metFORMIN (GLUCOPHAGE) 500 MG tablet Take 2 tablets (1,000 mg total) by mouth 2 (two) times daily with a meal. 01/08/13   Heather Laisure, PA-C  metoprolol (LOPRESSOR) 50 MG tablet Take 0.5 tablets (25 mg total) by mouth 2 (two) times daily. 01/08/13   Heather Laisure, PA-C  metoprolol tartrate (LOPRESSOR) 25 MG tablet Take 25 mg by mouth every morning.     Historical Provider, MD   BP 166/99 mmHg  Pulse 90  Temp(Src) 98 F (36.7 C) (Oral)  Resp 21  SpO2 94% Physical Exam  Constitutional: She is oriented to person, place, and time. She appears well-developed and well-nourished.  Non-toxic appearance. No distress.  HENT:  Head: Normocephalic and atraumatic.  Eyes: Conjunctivae, EOM and lids are normal. Pupils are equal, round, and reactive to light.  Neck: Normal range of motion. Neck supple. No tracheal deviation present. No thyroid mass present.  Cardiovascular: Normal rate, regular rhythm and normal heart sounds.  Exam reveals no gallop.   No murmur heard. Pulmonary/Chest: Effort normal and breath sounds normal. No stridor. No respiratory distress. She has no decreased breath sounds. She has no wheezes. She has no rhonchi. She has no rales.  Abdominal: Soft. Normal appearance and bowel sounds are normal. She exhibits no distension. There is no tenderness. There is no rebound and no CVA tenderness.  Musculoskeletal: Normal range of motion. She exhibits no edema or tenderness.  Neurological: She is alert and oriented to person, place, and time. She has normal strength. No cranial nerve deficit or sensory deficit. GCS eye subscore is 4. GCS verbal subscore is 5. GCS motor subscore is 6.  Skin: Skin is warm and dry. No abrasion and no rash noted.  Psychiatric: She has a normal mood and affect. Her speech is normal and behavior is normal.  Nursing note and vitals reviewed.   ED Course  Procedures (including critical care time) Labs Review Labs Reviewed   CBC - Abnormal; Notable for the following:    RBC 5.14 (*)    All other components within normal limits  BASIC METABOLIC PANEL    Imaging Review Dg Chest 2 View  10/30/2014   CLINICAL DATA:  Central and left chest pain since 2 a.m.  EXAM: CHEST  2 VIEW  COMPARISON:  November 29, 2012  FINDINGS: The heart size and mediastinal contours are stable. The heart size is upper limits of normal. There is no focal infiltrate, pulmonary edema, or pleural effusion. The visualized skeletal structures are unremarkable.  IMPRESSION: No active cardiopulmonary disease.   Electronically Signed   By: Abelardo Diesel M.D.   On: 10/30/2014 16:06   I have personally reviewed and evaluated these images and lab results as part of my medical decision-making.   EKG Interpretation None      MDM   Final diagnoses:  None    Patient given meds for  GERD and feels better. No concern for ACS or PE. Stable for discharge    Lacretia Leigh, MD 10/30/14 1820

## 2014-10-30 NOTE — Progress Notes (Signed)
EDCM spoke to patient at bedside.  Patient confirms she has [employer] without a pcp.  EDCM attempted to print list of accepting providers without success. Per [employer], to locate a provider patient must call [phone].  EDCM provided this number to the patient.  Explained importance and purpose of having a pcp.  Patient verbalized understanding.  Patient thankful for services.  No further EDCM needs at this time.

## 2014-10-30 NOTE — ED Notes (Signed)
Patient transported to X-ray 

## 2014-10-30 NOTE — Discharge Instructions (Signed)
Esophagitis Esophagitis is inflammation of the esophagus. It can involve swelling, soreness, and pain in the esophagus. This condition can make it difficult and painful to swallow. CAUSES  Most causes of esophagitis are not serious. Many different factors can cause esophagitis, including:  Gastroesophageal reflux disease (GERD). This is when acid from your stomach flows up into the esophagus.  Recurrent vomiting.  An allergic-type reaction.  Certain medicines, especially those that come in large pills.  Ingestion of harmful chemicals, such as household cleaning products.  Heavy alcohol use.  An infection of the esophagus.  Radiation treatment for cancer.  Certain diseases such as sarcoidosis, Crohn's disease, and scleroderma. These diseases may cause recurrent esophagitis. SYMPTOMS   Trouble swallowing.  Painful swallowing.  Chest pain.  Difficulty breathing.  Nausea.  Vomiting.  Abdominal pain. DIAGNOSIS  Your caregiver will take your history and do a physical exam. Depending upon what your caregiver finds, certain tests may also be done, including:  Barium X-ray. You will drink a solution that coats the esophagus, and X-rays will be taken.  Endoscopy. A lighted tube is put down the esophagus so your caregiver can examine the area.  Allergy tests. These can sometimes be arranged through follow-up visits. TREATMENT  Treatment will depend on the cause of your esophagitis. In some cases, steroids or other medicines may be given to help relieve your symptoms or to treat the underlying cause of your condition. Medicines that may be recommended include:  Viscous lidocaine, to soothe the esophagus.  Antacids.  Acid reducers.  Proton pump inhibitors.  Antiviral medicines for certain viral infections of the esophagus.  Antifungal medicines for certain fungal infections of the esophagus.  Antibiotic medicines, depending on the cause of the esophagitis. HOME CARE  INSTRUCTIONS   Avoid foods and drinks that seem to make your symptoms worse.  Eat small, frequent meals instead of large meals.  Avoid eating for the 3 hours prior to your bedtime.  If you have trouble taking pills, use a pill splitter to decrease the size and likelihood of the pill getting stuck or injuring the esophagus on the way down. Drinking water after taking a pill also helps.  Stop smoking if you smoke.  Maintain a healthy weight.  Wear loose-fitting clothing. Do not wear anything tight around your waist that causes pressure on your stomach.  Raise the head of your bed 6 to 8 inches with wood blocks to help you sleep. Extra pillows will not help.  Only take over-the-counter or prescription medicines as directed by your caregiver. SEEK IMMEDIATE MEDICAL CARE IF:  You have severe chest pain that radiates into your arm, neck, or jaw.  You feel sweaty, dizzy, or lightheaded.  You have shortness of breath.  You vomit blood.  You have difficulty or pain with swallowing.  You have bloody or black, tarry stools.  You have a fever.  You have a burning sensation in the chest more than 3 times a week for more than 2 weeks.  You cannot swallow, drink, or eat.  You drool because you cannot swallow your saliva. MAKE SURE YOU:  Understand these instructions.  Will watch your condition.  Will get help right away if you are not doing well or get worse. Document Released: 04/06/2004 Document Revised: 05/22/2011 Document Reviewed: 10/28/2010 [employer] Patient Information 2015 [employer]. This information is not intended to replace advice given to you by your health care provider. Make sure you discuss any questions you have with your health care provider.  

## 2014-10-30 NOTE — ED Notes (Signed)
Pt states around 2 am this morning while in the bed started having central chest pain. Pt states that she did get nauseated and vomited.  Pt states that the pain feels like when you have gas.  Pt states that she also has bad posterior headache "like when you have a sinus infection".  Pt also adds that she has a lump on left side of her neck that has gotten worse over the past year.

## 2014-11-23 ENCOUNTER — Encounter: Payer: Self-pay | Admitting: Obstetrics & Gynecology

## 2014-11-23 ENCOUNTER — Ambulatory Visit (INDEPENDENT_AMBULATORY_CARE_PROVIDER_SITE_OTHER): Payer: PRIVATE HEALTH INSURANCE | Admitting: Obstetrics & Gynecology

## 2014-11-23 VITALS — BP 198/105 | HR 85 | Ht <= 58 in | Wt 209.0 lb

## 2014-11-23 DIAGNOSIS — I10 Essential (primary) hypertension: Secondary | ICD-10-CM | POA: Diagnosis not present

## 2014-11-23 DIAGNOSIS — Z90710 Acquired absence of both cervix and uterus: Secondary | ICD-10-CM

## 2014-11-23 DIAGNOSIS — Z01419 Encounter for gynecological examination (general) (routine) without abnormal findings: Secondary | ICD-10-CM

## 2014-11-23 DIAGNOSIS — Z0000 Encounter for general adult medical examination without abnormal findings: Secondary | ICD-10-CM

## 2014-11-23 MED ORDER — PANTOPRAZOLE SODIUM 20 MG PO TBEC: 20.0000 mg | DELAYED_RELEASE_TABLET | Freq: Every day | ORAL | Status: DC

## 2014-11-23 MED ORDER — LISINOPRIL 20 MG PO TABS: 20.0000 mg | ORAL_TABLET | Freq: Every day | ORAL | Status: DC

## 2014-11-23 MED ORDER — GLYBURIDE 5 MG PO TABS: 5.0000 mg | ORAL_TABLET | Freq: Every day | ORAL | Status: DC

## 2014-11-23 MED ORDER — FLUCONAZOLE 150 MG PO TABS: 150.0000 mg | ORAL_TABLET | Freq: Once | ORAL | Status: DC

## 2014-11-23 MED ORDER — METFORMIN HCL 500 MG PO TABS: 1000.0000 mg | ORAL_TABLET | Freq: Two times a day (BID) | ORAL | Status: DC

## 2014-11-23 MED ORDER — HYDROCHLOROTHIAZIDE 25 MG PO TABS: 12.5000 mg | ORAL_TABLET | Freq: Every day | ORAL | Status: DC

## 2014-11-23 NOTE — Progress Notes (Signed)
Subjective:    GRETTELL Meyers is a 44 y.o. D AA P2 (3 grands) female who presents for an annual exam. The patient has no complaints today. The patient is  currently sexually active. GYN screening history: last pap: was normal. The patient wears seatbelts: yes. The patient participates in regular exercise: yes. Has the patient ever been transfused or tattooed?: yes. The patient reports that there is not domestic violence in her life.   Menstrual History: OB History    Gravida Para Term Preterm AB TAB SAB Ectopic Multiple Living   4 2 2  2  2   2       Menarche age: 23  No LMP recorded. Patient has had a hysterectomy.    The following portions of the patient's history were reviewed and updated as appropriate: allergies, current medications, past family history, past medical history, past social history, past surgical history and problem list.  Review of Systems A comprehensive review of systems was negative. She recently moved here from [place]. She has not been taking her meds and has an appt with a primary care doctor soon. She uses condoms.   Objective:    BP 198/105 mmHg  Pulse 85  Ht 4\' 8"  (1.422 m)  Wt 209 lb (94.802 kg)  BMI 46.88 kg/m2  General Appearance:    Alert, cooperative, no distress, appears stated age  Head:    Normocephalic, without obvious abnormality, atraumatic  Eyes:    PERRL, conjunctiva/corneas clear, EOM's intact, fundi    benign, both eyes  Ears:    Normal TM's and external ear canals, both ears  Nose:   Nares normal, septum midline, mucosa normal, no drainage    or sinus tenderness  Throat:   Lips, mucosa, and tongue normal; teeth and gums normal  Neck:   Supple, symmetrical, trachea midline, no adenopathy;    thyroid:  no enlargement/tenderness/nodules; no carotid   bruit or JVD  Back:     Symmetric, no curvature, ROM normal, no CVA tenderness  Lungs:     Clear to auscultation bilaterally, respirations unlabored  Chest Wall:    No tenderness or  deformity   Heart:    Regular rate and rhythm, S1 and S2 normal, no murmur, rub   or gallop  Breast Exam:    No tenderness, masses, or nipple abnormality  Abdomen:     Soft, non-tender, bowel sounds active all four quadrants,    no masses, no organomegaly  Genitalia:    Normal female without lesion, discharge or tenderness, yeast discharge in vault, no masses with bimanual exam     Extremities:   Extremities normal, atraumatic, no cyanosis or edema  Pulses:   2+ and symmetric all extremities  Skin:   Skin color, texture, turgor normal, no rashes or lesions  Lymph nodes:   Cervical, supraclavicular, and axillary nodes normal  Neurologic:   CNII-XII intact, normal strength, sensation and reflexes    throughout  .    Assessment:    Healthy female exam.    Plan:     Breast self exam technique reviewed and patient encouraged to perform self-exam monthly. Mammogram. I have refilled her meds and strongly encouraged her to see her Primary care MD. We discussed risks of strokes/death.   difucan for yeast Declines flu vaccine

## 2014-11-23 NOTE — Progress Notes (Signed)
Patient states she has not had insurance and hasnt been to the doctor in awhile- she hasnt been taking medications because she has not had any. Kathrene Alu RN BSN

## 2014-11-24 ENCOUNTER — Ambulatory Visit (HOSPITAL_BASED_OUTPATIENT_CLINIC_OR_DEPARTMENT_OTHER)
Admission: RE | Admit: 2014-11-24 | Discharge: 2014-11-24 | Disposition: A | Discharge status: Home / Self Care | Payer: No Typology Code available for payment source | Source: Ambulatory Visit | Attending: Obstetrics & Gynecology | Admitting: Obstetrics & Gynecology

## 2014-11-24 DIAGNOSIS — Z1231 Encounter for screening mammogram for malignant neoplasm of breast: Secondary | ICD-10-CM | POA: Insufficient documentation

## 2014-11-24 DIAGNOSIS — Z0000 Encounter for general adult medical examination without abnormal findings: Secondary | ICD-10-CM

## 2014-11-24 MED ORDER — PANTOPRAZOLE SODIUM 20 MG PO TBEC: 20.0000 mg | DELAYED_RELEASE_TABLET | Freq: Every day | ORAL | Status: DC

## 2014-11-24 MED ORDER — HYDROCHLOROTHIAZIDE 25 MG PO TABS: 12.5000 mg | ORAL_TABLET | Freq: Every day | ORAL | Status: DC

## 2014-11-24 MED ORDER — GLYBURIDE 5 MG PO TABS: 5.0000 mg | ORAL_TABLET | Freq: Every day | ORAL | Status: DC

## 2014-11-24 MED ORDER — METFORMIN HCL 500 MG PO TABS: 1000.0000 mg | ORAL_TABLET | Freq: Two times a day (BID) | ORAL | Status: DC

## 2014-11-24 MED ORDER — LISINOPRIL 20 MG PO TABS: 20.0000 mg | ORAL_TABLET | Freq: Every day | ORAL | Status: DC

## 2014-11-24 MED ORDER — FLUCONAZOLE 150 MG PO TABS: 150.0000 mg | ORAL_TABLET | Freq: Once | ORAL | Status: DC

## 2014-11-24 NOTE — Addendum Note (Signed)
Addended by: Phill Myron on: 11/24/2014 08:33 AM   Modules accepted: Orders

## 2014-11-26 ENCOUNTER — Telehealth: Payer: Self-pay | Admitting: Family

## 2014-11-26 NOTE — Telephone Encounter (Signed)
Left msg for pt to call and schedule new pt appt   ----- Message -----   From: Laren Everts   Sent: 11/25/2014  1:53 PM    To: [facility], *   This patient would like to establish with Lenna Sciara, will someone please reach out and get her set up.   Thanks!

## 2014-12-21 ENCOUNTER — Encounter (HOSPITAL_COMMUNITY): Payer: Self-pay | Admitting: Emergency Medicine

## 2014-12-21 ENCOUNTER — Emergency Department (HOSPITAL_COMMUNITY)
Admission: EM | Admit: 2014-12-21 | Discharge: 2014-12-21 | Disposition: A | Discharge status: Home / Self Care | Payer: PRIVATE HEALTH INSURANCE | Attending: Emergency Medicine | Admitting: Emergency Medicine

## 2014-12-21 DIAGNOSIS — I10 Essential (primary) hypertension: Secondary | ICD-10-CM | POA: Insufficient documentation

## 2014-12-21 DIAGNOSIS — R519 Headache, unspecified: Secondary | ICD-10-CM

## 2014-12-21 DIAGNOSIS — E669 Obesity, unspecified: Secondary | ICD-10-CM | POA: Insufficient documentation

## 2014-12-21 DIAGNOSIS — J45909 Unspecified asthma, uncomplicated: Secondary | ICD-10-CM | POA: Insufficient documentation

## 2014-12-21 DIAGNOSIS — Z880 Allergy status to penicillin: Secondary | ICD-10-CM | POA: Insufficient documentation

## 2014-12-21 DIAGNOSIS — R51 Headache: Secondary | ICD-10-CM | POA: Insufficient documentation

## 2014-12-21 DIAGNOSIS — R011 Cardiac murmur, unspecified: Secondary | ICD-10-CM | POA: Insufficient documentation

## 2014-12-21 DIAGNOSIS — Z79899 Other long term (current) drug therapy: Secondary | ICD-10-CM | POA: Insufficient documentation

## 2014-12-21 DIAGNOSIS — E119 Type 2 diabetes mellitus without complications: Secondary | ICD-10-CM | POA: Insufficient documentation

## 2014-12-21 MED ORDER — DIPHENHYDRAMINE HCL 50 MG/ML IJ SOLN
25.0000 mg | Freq: Once | INTRAMUSCULAR | Status: AC
  Administered 2014-12-21: 25 mg via INTRAVENOUS
  Filled 2014-12-21: qty 1

## 2014-12-21 MED ORDER — KETOROLAC TROMETHAMINE 30 MG/ML IJ SOLN
30.0000 mg | Freq: Once | INTRAMUSCULAR | Status: AC
  Administered 2014-12-21: 30 mg via INTRAVENOUS
  Filled 2014-12-21: qty 1

## 2014-12-21 MED ORDER — SODIUM CHLORIDE 0.9 % IV SOLN: 1000.0000 mL | INTRAVENOUS | Status: DC

## 2014-12-21 MED ORDER — SODIUM CHLORIDE 0.9 % IV SOLN
1000.0000 mL | Freq: Once | INTRAVENOUS | Status: AC
  Administered 2014-12-21: 1000 mL via INTRAVENOUS

## 2014-12-21 MED ORDER — METOCLOPRAMIDE HCL 5 MG/ML IJ SOLN
10.0000 mg | Freq: Once | INTRAMUSCULAR | Status: AC
  Administered 2014-12-21: 10 mg via INTRAVENOUS
  Filled 2014-12-21: qty 2

## 2014-12-21 NOTE — ED Notes (Signed)
Per pt, states headache which started today, states it is not like her usual headaches

## 2014-12-21 NOTE — Discharge Instructions (Signed)

## 2014-12-21 NOTE — ED Provider Notes (Signed)
CSN: [id number]     Arrival date & time 12/21/14  1631 History   First MD Initiated Contact with Patient 12/21/14 2052     Chief Complaint  Patient presents with  . Headache     (Consider location/radiation/quality/duration/timing/severity/associated sxs/prior Treatment) HPI Patient states she has a headache this started yesterday. It was gradual in onset. She reports that encompasses all of her forehead and the back of her head. She reports is a very tight and throbbing ache. She has tried Tylenol without relief. Patient reports light sensitivity but no fever, visual dysfunction, incoordination or weakness, no gait dysfunction, no nausea or vomiting. Patient denies any associated signs of infection such as fever, sinus drainage, sore throat or earache. She reports she does get headaches intermittently but this one seems different and has persisted. Past Medical History  Diagnosis Date  . Diabetes mellitus without complication ([place])   . Hypertension   . Asthma   . Heart murmur   . Obesity    Past Surgical History  Procedure Laterality Date  . Tubal ligation    . Abdominal hysterectomy    . Leep  1988  . D&c  1992   Family History  Problem Relation Age of Onset  . Cancer Maternal Aunt     Breast  . Hypertension Maternal Grandmother   . Diabetes Neg Hx   . Heart disease Neg Hx   . Hyperlipidemia Neg Hx    Social History  Substance Use Topics  . Smoking status: Never Smoker   . Smokeless tobacco: None  . Alcohol Use: Yes   OB History    Gravida Para Term Preterm AB TAB SAB Ectopic Multiple Living   4 2 2  2  2   2      Review of Systems  10 Systems reviewed and are negative for acute change except as noted in the HPI.   Allergies  Penicillins  Home Medications   Prior to Admission medications   Medication Sig Start Date End Date Taking? Authorizing Provider  amLODipine (NORVASC) 10 MG tablet Take 0.5 tablets (5 mg total) by mouth daily. 01/08/13  Yes Heather  Laisure, PA-C  glyBURIDE (DIABETA) 5 MG tablet Take 1 tablet (5 mg total) by mouth daily with breakfast. 11/24/14  Yes Myra C Hulan Fray, MD  hydrochlorothiazide (HYDRODIURIL) 25 MG tablet Take 0.5 tablets (12.5 mg total) by mouth daily. 11/24/14  Yes Myra Marijo Sanes, MD  lisinopril (PRINIVIL,ZESTRIL) 20 MG tablet Take 1 tablet (20 mg total) by mouth daily. 11/24/14  Yes Emily Filbert, MD  metFORMIN (GLUCOPHAGE) 500 MG tablet Take 2 tablets (1,000 mg total) by mouth 2 (two) times daily with a meal. 11/24/14  Yes Myra C Hulan Fray, MD  metoprolol (LOPRESSOR) 50 MG tablet Take 0.5 tablets (25 mg total) by mouth 2 (two) times daily. 01/08/13  Yes Heather Laisure, PA-C  beclomethasone (QVAR) 40 MCG/ACT inhaler Inhale 2 puffs into the lungs 2 (two) times daily. Patient not taking: Reported on 11/23/2014 01/08/13   Hyman Bible, PA-C  fluconazole (DIFLUCAN) 150 MG tablet Take 1 tablet (150 mg total) by mouth once. Patient not taking: Reported on 12/21/2014 11/24/14   Emily Filbert, MD  pantoprazole (PROTONIX) 20 MG tablet Take 1 tablet (20 mg total) by mouth daily. Patient not taking: Reported on 12/21/2014 11/24/14   Emily Filbert, MD  sucralfate (CARAFATE) 1 G tablet Take 1 tablet (1 g total) by mouth 4 (four) times daily. Patient not taking: Reported on 11/23/2014 10/30/14  Lacretia Leigh, MD   BP 158/95 mmHg  Pulse 88  Temp(Src) 98 F (36.7 C) (Oral)  Resp 16  SpO2 99% Physical Exam  Constitutional: She is oriented to person, place, and time. She appears well-developed and well-nourished.  HENT:  Head: Normocephalic and atraumatic.  Nose: Nose normal.  Mouth/Throat: Oropharynx is clear and moist.  Bilateral TMs normal  Eyes: EOM are normal. Pupils are equal, round, and reactive to light.  Neck: Neck supple.  Cardiovascular: Normal rate, regular rhythm, normal heart sounds and intact distal pulses.   Pulmonary/Chest: Effort normal and breath sounds normal.  Abdominal: Soft. Bowel sounds are normal. She exhibits no  distension. There is no tenderness.  Musculoskeletal: Normal range of motion. She exhibits no edema.  Neurological: She is alert and oriented to person, place, and time. She has normal strength. No cranial nerve deficit. She exhibits normal muscle tone. Coordination normal. GCS eye subscore is 4. GCS verbal subscore is 5. GCS motor subscore is 6.  Skin: Skin is warm, dry and intact.  Psychiatric: She has a normal mood and affect.    ED Course  Procedures (including critical care time) Labs Review Labs Reviewed - No data to display  Imaging Review No results found. I have personally reviewed and evaluated these images and lab results as part of my medical decision-making.   EKG Interpretation None     Recheck: Headache is resolved after fluids, Toradol, Benadryl and Reglan. MDM   Final diagnoses:  Acute nonintractable headache, unspecified headache type   Patient reports she had a history of frequent headaches for several years, at one point if she didn't have a headache she thought that there was something wrong. These improved for a number of years. Now more recently, she has started to get more regular headaches again. She had tried Tylenol without relief of her headache. At this time headache has been completely resolved with a migraine cocktail. Patient is well in appearance. She has no associated signs of infectious illness or neurologic complications. As her headaches are recurring and are likely migraine in nature, she is advised to follow-up with neurology.    Charlesetta Shanks, MD 12/21/14 [phone]

## 2014-12-25 ENCOUNTER — Emergency Department (HOSPITAL_BASED_OUTPATIENT_CLINIC_OR_DEPARTMENT_OTHER)
Admission: EM | Admit: 2014-12-25 | Discharge: 2014-12-25 | Disposition: A | Discharge status: Home / Self Care | Payer: No Typology Code available for payment source | Attending: Emergency Medicine | Admitting: Emergency Medicine

## 2014-12-25 ENCOUNTER — Encounter (HOSPITAL_BASED_OUTPATIENT_CLINIC_OR_DEPARTMENT_OTHER): Payer: Self-pay

## 2014-12-25 DIAGNOSIS — E669 Obesity, unspecified: Secondary | ICD-10-CM | POA: Insufficient documentation

## 2014-12-25 DIAGNOSIS — Z880 Allergy status to penicillin: Secondary | ICD-10-CM | POA: Diagnosis not present

## 2014-12-25 DIAGNOSIS — R51 Headache: Secondary | ICD-10-CM | POA: Insufficient documentation

## 2014-12-25 DIAGNOSIS — R011 Cardiac murmur, unspecified: Secondary | ICD-10-CM | POA: Diagnosis not present

## 2014-12-25 DIAGNOSIS — E119 Type 2 diabetes mellitus without complications: Secondary | ICD-10-CM | POA: Diagnosis not present

## 2014-12-25 DIAGNOSIS — H53149 Visual discomfort, unspecified: Secondary | ICD-10-CM | POA: Diagnosis not present

## 2014-12-25 DIAGNOSIS — I10 Essential (primary) hypertension: Secondary | ICD-10-CM | POA: Insufficient documentation

## 2014-12-25 DIAGNOSIS — J45909 Unspecified asthma, uncomplicated: Secondary | ICD-10-CM | POA: Insufficient documentation

## 2014-12-25 DIAGNOSIS — Z79899 Other long term (current) drug therapy: Secondary | ICD-10-CM | POA: Diagnosis not present

## 2014-12-25 DIAGNOSIS — R519 Headache, unspecified: Secondary | ICD-10-CM

## 2014-12-25 DIAGNOSIS — M542 Cervicalgia: Secondary | ICD-10-CM | POA: Diagnosis not present

## 2014-12-25 DIAGNOSIS — Z7951 Long term (current) use of inhaled steroids: Secondary | ICD-10-CM | POA: Diagnosis not present

## 2014-12-25 LAB — CBG MONITORING, ED: GLUCOSE-CAPILLARY: 220 mg/dL — AB (ref 65–99)

## 2014-12-25 MED ORDER — METOCLOPRAMIDE HCL 5 MG/ML IJ SOLN
10.0000 mg | Freq: Once | INTRAMUSCULAR | Status: AC
  Administered 2014-12-25: 10 mg via INTRAVENOUS
  Filled 2014-12-25: qty 2

## 2014-12-25 MED ORDER — KETOROLAC TROMETHAMINE 30 MG/ML IJ SOLN
30.0000 mg | Freq: Once | INTRAMUSCULAR | Status: AC
  Administered 2014-12-25: 30 mg via INTRAVENOUS
  Filled 2014-12-25: qty 1

## 2014-12-25 MED ORDER — SODIUM CHLORIDE 0.9 % IV BOLUS (SEPSIS)
1000.0000 mL | Freq: Once | INTRAVENOUS | Status: AC
  Administered 2014-12-25: 1000 mL via INTRAVENOUS

## 2014-12-25 MED ORDER — DIPHENHYDRAMINE HCL 50 MG/ML IJ SOLN
25.0000 mg | Freq: Once | INTRAMUSCULAR | Status: AC
  Administered 2014-12-25: 25 mg via INTRAVENOUS
  Filled 2014-12-25: qty 1

## 2014-12-25 NOTE — ED Notes (Signed)
HA since Monday-BS 468 at 545pm

## 2014-12-25 NOTE — ED Notes (Signed)
All meds given thru IV

## 2014-12-25 NOTE — ED Provider Notes (Signed)
CSN: [id number]   Arrival date & time 12/25/14 1859  History  By signing my name below, I, Margaret Meyers, attest that this documentation has been prepared under the direction and in the presence of Malvin Johns, MD. Electronically Signed: Altamease Meyers, ED Scribe. 12/25/2014. 7:32 PM.  Chief Complaint  Patient presents with  . Headache    HPI The history is provided by the patient. No language interpreter was used.   Margaret Meyers is a 44 y.o. female with history of HTN and DM who presents to the Emergency Department complaining of an ongoing headache with gradual onset 5 days ago. Pt has history of frequent headaches, years ago she had daily headaches and notes that over the last 2 months she has had week-long headaches that resolve spontaneously. This constant pain is over the right side of the face and radiates down the neck bilaterally. She describes the pain as throbbing and rates it 9/10 in severity. This pain is similar in quality to the ones that she has had in the past.  Associated symptoms include nausea and intermittent blurred vision. Pt denies vomiting, change in strength or sensation, new issue with coordination. 4 days ago she was treated with a migraine cocktail in the ED and was discharged with some pain improvement. She is concerned because both her parents have history of aneurysm. She last had a CT scan without contrast months ago. Today her blood sugar was over 400 at home. Her sugars are usually controlled with glyburide and she has not missed a dose. She is in the process of establishing primary care and has a scheduled appointment on 01/26/15.   Past Medical History  Diagnosis Date  . Diabetes mellitus without complication ([place])   . Hypertension   . Asthma   . Heart murmur   . Obesity     Past Surgical History  Procedure Laterality Date  . Tubal ligation    . Abdominal hysterectomy    . Leep  1988  . D&c  1992    Family History  Problem Relation Age of  Onset  . Cancer Maternal Aunt     Breast  . Hypertension Maternal Grandmother   . Diabetes Neg Hx   . Heart disease Neg Hx   . Hyperlipidemia Neg Hx     Social History  Substance Use Topics  . Smoking status: Never Smoker   . Smokeless tobacco: None  . Alcohol Use: Yes     Review of Systems  Constitutional: Negative for fever, chills, diaphoresis and fatigue.  HENT: Negative for congestion, rhinorrhea and sneezing.   Eyes: Positive for photophobia.  Respiratory: Negative for cough, chest tightness and shortness of breath.   Cardiovascular: Negative for chest pain and leg swelling.  Gastrointestinal: Negative for nausea, vomiting, abdominal pain, diarrhea and blood in stool.  Genitourinary: Negative for frequency, hematuria, flank pain and difficulty urinating.  Musculoskeletal: Positive for neck pain. Negative for back pain and arthralgias.  Skin: Negative for rash.  Neurological: Positive for headaches. Negative for dizziness, speech difficulty, weakness and numbness.   Home Medications   Prior to Admission medications   Medication Sig Start Date End Date Taking? Authorizing Provider  amLODipine (NORVASC) 10 MG tablet Take 0.5 tablets (5 mg total) by mouth daily. 01/08/13   Heather Laisure, PA-C  beclomethasone (QVAR) 40 MCG/ACT inhaler Inhale 2 puffs into the lungs 2 (two) times daily. Patient not taking: Reported on 11/23/2014 01/08/13   Hyman Bible, PA-C  fluconazole (DIFLUCAN) 150 MG tablet  Take 1 tablet (150 mg total) by mouth once. Patient not taking: Reported on 12/21/2014 11/24/14   Emily Filbert, MD  glyBURIDE (DIABETA) 5 MG tablet Take 1 tablet (5 mg total) by mouth daily with breakfast. 11/24/14   Emily Filbert, MD  hydrochlorothiazide (HYDRODIURIL) 25 MG tablet Take 0.5 tablets (12.5 mg total) by mouth daily. 11/24/14   Emily Filbert, MD  lisinopril (PRINIVIL,ZESTRIL) 20 MG tablet Take 1 tablet (20 mg total) by mouth daily. 11/24/14   Emily Filbert, MD  metFORMIN  (GLUCOPHAGE) 500 MG tablet Take 2 tablets (1,000 mg total) by mouth 2 (two) times daily with a meal. 11/24/14   Emily Filbert, MD  metoprolol (LOPRESSOR) 50 MG tablet Take 0.5 tablets (25 mg total) by mouth 2 (two) times daily. 01/08/13   Heather Laisure, PA-C  pantoprazole (PROTONIX) 20 MG tablet Take 1 tablet (20 mg total) by mouth daily. Patient not taking: Reported on 12/21/2014 11/24/14   Emily Filbert, MD  sucralfate (CARAFATE) 1 G tablet Take 1 tablet (1 g total) by mouth 4 (four) times daily. Patient not taking: Reported on 11/23/2014 10/30/14   Lacretia Leigh, MD    Allergies  Penicillins  Triage Vitals: BP 202/103 mmHg  Pulse 72  Temp(Src) 98.2 F (36.8 C) (Oral)  Resp 16  Ht 4\' 8"  (1.422 m)  Wt 210 lb (95.255 kg)  BMI 47.11 kg/m2  SpO2 99%  Physical Exam  Constitutional: She is oriented to person, place, and time. She appears well-developed and well-nourished.  HENT:  Head: Normocephalic and atraumatic.  Eyes: Conjunctivae and EOM are normal. Pupils are equal, round, and reactive to light.  Fundi not well visualized  Neck: Normal range of motion. Neck supple.  No meningismus  Cardiovascular: Normal rate, regular rhythm and normal heart sounds.   Pulmonary/Chest: Effort normal and breath sounds normal. No respiratory distress. She has no wheezes. She has no rales. She exhibits no tenderness.  Abdominal: Soft. Bowel sounds are normal. There is no tenderness. There is no rebound and no guarding.  Musculoskeletal: Normal range of motion. She exhibits no edema.  Lymphadenopathy:    She has no cervical adenopathy.  Neurological: She is alert and oriented to person, place, and time. She has normal strength. No cranial nerve deficit or sensory deficit.  Finger to nose intact, gait normal, no pronator drift  Skin: Skin is warm and dry. No rash noted.  Psychiatric: She has a normal mood and affect.    ED Course  Procedures   DIAGNOSTIC STUDIES: Oxygen Saturation is 99% on RA,  normal by my interpretation.    COORDINATION OF CARE: 7:31 PM Discussed treatment plan which includes pain management with pt at bedside and pt agreed to plan.  Labs Reviewed  CBG MONITORING, ED - Abnormal; Notable for the following:    Glucose-Capillary 220 (*)    All other components within normal limits  CBG MONITORING, ED    Imaging Review No results found.   MDM   Final diagnoses:  Headache disorder   PT was given a migraine cocktail and is feeling much better after that. Her blood sugar in the ED was not concerning the elevated. She presents with a headache similar to headaches that she's been having for the last several years. She did have a period of time where she was not having headaches but now they seem to return. There is no symptoms that would be concerning for subarachnoid hemorrhage or meningitis. However I did have  a discussion with the patient that since she has a strong family history of aneurysms, she should be screened as an outpatient for aneurysms. She had a recent noncontrast head CT in August of this year which was unremarkable. This was done in the ED for headache presentation. She has a follow-up appointment with a PCP next month. I advised her return here if she has any worsening symptoms. We also discussed better blood sugar management.  I personally performed the services described in this documentation, which was scribed in my presence.  The recorded information has been reviewed and considered.     Malvin Johns, MD 12/25/14 2121

## 2014-12-25 NOTE — ED Notes (Signed)
MD at bedside. 

## 2014-12-25 NOTE — Discharge Instructions (Signed)

## 2015-01-04 ENCOUNTER — Ambulatory Visit (INDEPENDENT_AMBULATORY_CARE_PROVIDER_SITE_OTHER): Payer: PRIVATE HEALTH INSURANCE | Admitting: Neurology

## 2015-01-04 ENCOUNTER — Encounter (HOSPITAL_BASED_OUTPATIENT_CLINIC_OR_DEPARTMENT_OTHER): Payer: Self-pay | Admitting: *Deleted

## 2015-01-04 ENCOUNTER — Encounter: Payer: Self-pay | Admitting: Neurology

## 2015-01-04 ENCOUNTER — Emergency Department (HOSPITAL_BASED_OUTPATIENT_CLINIC_OR_DEPARTMENT_OTHER)
Admission: EM | Admit: 2015-01-04 | Discharge: 2015-01-04 | Disposition: A | Discharge status: Home / Self Care | Payer: No Typology Code available for payment source | Attending: Emergency Medicine | Admitting: Emergency Medicine

## 2015-01-04 ENCOUNTER — Ambulatory Visit: Payer: PRIVATE HEALTH INSURANCE | Admitting: Family

## 2015-01-04 ENCOUNTER — Telehealth: Payer: Self-pay | Admitting: *Deleted

## 2015-01-04 VITALS — BP 205/110 | HR 74 | Ht <= 58 in | Wt 211.0 lb

## 2015-01-04 DIAGNOSIS — R519 Headache, unspecified: Secondary | ICD-10-CM

## 2015-01-04 DIAGNOSIS — J45909 Unspecified asthma, uncomplicated: Secondary | ICD-10-CM | POA: Diagnosis not present

## 2015-01-04 DIAGNOSIS — Z880 Allergy status to penicillin: Secondary | ICD-10-CM | POA: Diagnosis not present

## 2015-01-04 DIAGNOSIS — E669 Obesity, unspecified: Secondary | ICD-10-CM | POA: Insufficient documentation

## 2015-01-04 DIAGNOSIS — R51 Headache: Secondary | ICD-10-CM | POA: Insufficient documentation

## 2015-01-04 DIAGNOSIS — R011 Cardiac murmur, unspecified: Secondary | ICD-10-CM | POA: Insufficient documentation

## 2015-01-04 DIAGNOSIS — Z79899 Other long term (current) drug therapy: Secondary | ICD-10-CM | POA: Insufficient documentation

## 2015-01-04 DIAGNOSIS — I10 Essential (primary) hypertension: Secondary | ICD-10-CM | POA: Diagnosis not present

## 2015-01-04 DIAGNOSIS — E119 Type 2 diabetes mellitus without complications: Secondary | ICD-10-CM | POA: Insufficient documentation

## 2015-01-04 DIAGNOSIS — H539 Unspecified visual disturbance: Secondary | ICD-10-CM | POA: Insufficient documentation

## 2015-01-04 LAB — BASIC METABOLIC PANEL
Anion gap: 5 (ref 5–15)
BUN: 16 mg/dL (ref 6–20)
CHLORIDE: 102 mmol/L (ref 101–111)
CO2: 29 mmol/L (ref 22–32)
CREATININE: 0.76 mg/dL (ref 0.44–1.00)
Calcium: 8.4 mg/dL — ABNORMAL LOW (ref 8.9–10.3)
GFR calc Af Amer: >60 mL/min (ref >60)
GFR calc non Af Amer: >60 mL/min (ref >60)
Glucose, Bld: 302 mg/dL — ABNORMAL HIGH (ref 65–99)
POTASSIUM: 3.6 mmol/L (ref 3.5–5.1)
Sodium: 136 mmol/L (ref 135–145)

## 2015-01-04 LAB — CBC
HCT: 39.6 % (ref 36.0–46.0)
Hemoglobin: 12.7 g/dL (ref 12.0–15.0)
MCH: 26.2 pg (ref 26.0–34.0)
MCHC: 32.1 g/dL (ref 30.0–36.0)
MCV: 81.6 fL (ref 78.0–100.0)
PLATELETS: 287 10*3/uL (ref 150–400)
RBC: 4.85 MIL/uL (ref 3.87–5.11)
RDW: 14.2 % (ref 11.5–15.5)
WBC: 7.3 10*3/uL (ref 4.0–10.5)

## 2015-01-04 LAB — CBG MONITORING, ED: GLUCOSE-CAPILLARY: 293 mg/dL — AB (ref 65–99)

## 2015-01-04 LAB — TROPONIN I

## 2015-01-04 MED ORDER — AMLODIPINE BESYLATE 10 MG PO TABS: 10.0000 mg | ORAL_TABLET | Freq: Every day | ORAL | Status: DC

## 2015-01-04 MED ORDER — METOPROLOL TARTRATE 50 MG PO TABS: 25.0000 mg | ORAL_TABLET | Freq: Two times a day (BID) | ORAL | Status: DC

## 2015-01-04 MED ORDER — METOCLOPRAMIDE HCL 5 MG/ML IJ SOLN
10.0000 mg | Freq: Once | INTRAMUSCULAR | Status: AC
  Administered 2015-01-04: 10 mg via INTRAVENOUS
  Filled 2015-01-04: qty 2

## 2015-01-04 MED ORDER — METOPROLOL TARTRATE 50 MG PO TABS
50.0000 mg | ORAL_TABLET | Freq: Once | ORAL | Status: AC
  Administered 2015-01-04: 50 mg via ORAL
  Filled 2015-01-04: qty 1

## 2015-01-04 MED ORDER — AMLODIPINE BESYLATE 5 MG PO TABS
10.0000 mg | ORAL_TABLET | Freq: Once | ORAL | Status: AC
  Administered 2015-01-04: 10 mg via ORAL
  Filled 2015-01-04: qty 2

## 2015-01-04 MED ORDER — DIPHENHYDRAMINE HCL 50 MG/ML IJ SOLN
25.0000 mg | Freq: Once | INTRAMUSCULAR | Status: AC
  Administered 2015-01-04: 25 mg via INTRAVENOUS
  Filled 2015-01-04: qty 1

## 2015-01-04 MED ORDER — METOPROLOL TARTRATE 50 MG PO TABS: 50.0000 mg | ORAL_TABLET | Freq: Two times a day (BID) | ORAL | Status: DC

## 2015-01-04 NOTE — Progress Notes (Signed)
[facility]    Provider:  Dr Jaynee Eagles Referring Provider: No ref. provider found Primary Care Physician:  Nance Pear., NP  CC:  headache  HPI:  Margaret Meyers is a 44 y.o. female here as a referral from Dr. No ref. provider found for headaches.PMHx obesity, uncontrolled diabetes and HTN.  Her blood pressures have been very high. The lowest it has been in the last year is 180/98. She is taking her blood pressure medications. She has not been to a primary care for about a year due to insurance problems. Today in the office 205/110 manual. Her headaches started in 2011 since her mother passed. Then her dad died. Her mother died with brain aneurysm and father with a brain hemorrhage. She was seen by neurology is [place]. She thinks they saw a meningioma on imaging. Around that time she found out her blood pressure and glucse were both high. She went to the ED last week because her glucose was 468 and she took metformin and was in the 200s in the ED. She does not take metformin every day because it makes her sick. She takes glyburide. She is on lisinopril, hctz. She is out of the amlodipine and metoprolol. Her headaches have been worse since the summer. Pain and pressure behind the eyes, pain in the back of the head and neck, and oressure on top of the head. Headaches are daily. She goes to sleep with the headache and wakes up with the headache. She ran out of her metoprolol and has not been taking it. She has light sensitivity and has to squint most of the days, she wears her shades all day, she has a lot of nausea.   Reviewed notes, labs and imaging from outside physicians, which showed:   CT of the head 10/30/2014 showed No acute intracranial abnormalities including mass lesion or mass effect, hydrocephalus, extra-axial fluid collection, midline shift, hemorrhage, or acute infarction, large ischemic events (personally reviewed images)  BMP 10/2014 glucose 375, cbc  unremarkable     Review of Systems: Patient complains of symptoms per HPI as well as the following symptoms: weight gain, blurred vision, easy bruising, palpitations, snoring, feeling cold, headache, dizziness, allergies, moles, headache. Pertinent negatives per HPI. All others negative.   Social History   Social History  . Marital Status: Single    Spouse Name: N/A  . Number of Children: 1  . Years of Education: 12+   Occupational History  . [employer]    Social History Main Topics  . Smoking status: Never Smoker   . Smokeless tobacco: Not on file  . Alcohol Use: Yes  . Drug Use: No  . Sexual Activity: Yes    Birth Control/ Protection: Surgical   Other Topics Concern  . Not on file   Social History Narrative   Lives at home with daughter.   Caffeine use: Drinks coffee (1 cup every other day)    Family History  Problem Relation Age of Onset  . Cancer Maternal Aunt     Breast  . Hypertension Maternal Grandmother   . Diabetes Neg Hx   . Heart disease Neg Hx   . Hyperlipidemia Neg Hx   . Migraines Neg Hx     Past Medical History  Diagnosis Date  . Diabetes mellitus without complication ([place])   . Hypertension   . Asthma   . Heart murmur   . Obesity     Past Surgical History  Procedure Laterality Date  . Tubal ligation    .  Abdominal hysterectomy    . Leep  1988  . D&c  1992    Current Outpatient Prescriptions  Medication Sig Dispense Refill  . glyBURIDE (DIABETA) 5 MG tablet Take 1 tablet (5 mg total) by mouth daily with breakfast. 30 tablet 0  . hydrochlorothiazide (HYDRODIURIL) 25 MG tablet Take 0.5 tablets (12.5 mg total) by mouth daily. 30 tablet 0  . lisinopril (PRINIVIL,ZESTRIL) 20 MG tablet Take 1 tablet (20 mg total) by mouth daily. 30 tablet 0  . metFORMIN (GLUCOPHAGE) 500 MG tablet Take 2 tablets (1,000 mg total) by mouth 2 (two) times daily with a meal. 60 tablet 0  . amLODipine (NORVASC) 10 MG tablet Take 0.5 tablets (5 mg total) by mouth  daily. (Patient not taking: Reported on 01/04/2015) 30 tablet 0  . beclomethasone (QVAR) 40 MCG/ACT inhaler Inhale 2 puffs into the lungs 2 (two) times daily. (Patient not taking: Reported on 11/23/2014) 1 Inhaler 0  . metoprolol (LOPRESSOR) 50 MG tablet Take 0.5 tablets (25 mg total) by mouth 2 (two) times daily. 30 tablet 11   No current facility-administered medications for this visit.    Allergies as of 01/04/2015 - Review Complete 01/04/2015  Allergen Reaction Noted  . Penicillins Hives 07/04/2012    Vitals: BP 205/110 mmHg  Pulse 74  Ht 4\' 8"  (1.422 m)  Wt 211 lb (95.709 kg)  BMI 47.33 kg/m2  SpO2 98% Last Weight:  Wt Readings from Last 1 Encounters:  01/04/15 211 lb (95.709 kg)   Last Height:   Ht Readings from Last 1 Encounters:  01/04/15 4\' 8"  (1.422 m)   Physical exam: Exam: Gen: NAD, conversant, well nourised, obese, well groomed                     CV: RRR, no MRG. No Carotid Bruits. No peripheral edema, warm, nontender Eyes: Conjunctivae clear without exudates or hemorrhage  Neuro: Detailed Neurologic Exam  Speech:    Speech is normal; fluent and spontaneous with normal comprehension.  Cognition:    The patient is oriented to person, place, and time;     recent and remote memory intact;     language fluent;     normal attention, concentration,     fund of knowledge Cranial Nerves:    The pupils are equal, round, and reactive to light. The fundi are normal and spontaneous venous pulsations are present. Visual fields are full to finger confrontation. Extraocular movements are intact. Trigeminal sensation is intact and the muscles of mastication are normal. The face is symmetric. The palate elevates in the midline. Hearing intact. Voice is normal. Shoulder shrug is normal. The tongue has normal motion without fasciculations.   Coordination:    Normal finger to nose and heel to shin. Normal rapid alternating movements.   Gait:    Heel-toe and tandem gait are  normal.   Motor Observation:    No asymmetry, no atrophy, and no involuntary movements noted. Tone:    Normal muscle tone.    Posture:    Posture is normal. normal erect    Strength:    Strength is V/V in the upper and lower limbs.      Sensation: intact to LT     Reflex Exam:  DTR's: Absent AJs otherwise deep tendon reflexes in the upper and lower extremities are normal bilaterally.   Toes:    The toes are downgoing bilaterally.   Clonus:    Clonus is absent.  Assessment/Plan:  44 year old with uncontrolled HTN and uncontrolled DM here for headaches. BP extremely high today in office. She was on 4 agents previously and ran out of 2 of them. Suspect h/a due to uncontrolled HTN. She has f/u with a new pcp in November, we are calling to see if we can get her in sooner. Will restart her metoprolol, this is a good headache medication as well as BP medication. Will request records from [facility] neurology. Repeat BMP today in the office, last bmp showed a glucose of 375. Discussed compliance and importance of f.u with pcp.  To prevent or relieve headaches, try the following: Cool Compress. Lie down and place a cool compress on your head.  Avoid headache triggers. If certain foods or odors seem to have triggered your migraines in the past, avoid them. A headache diary might help you identify triggers.  Include physical activity in your daily routine. Try a daily walk or other moderate aerobic exercise.  Manage stress. Find healthy ways to cope with the stressors, such as delegating tasks on your to-do list.  Practice relaxation techniques. Try deep breathing, yoga, massage and visualization.  Eat regularly. Eating regularly scheduled meals and maintaining a healthy diet might help prevent headaches. Also, drink plenty of fluids.  Follow a regular sleep schedule. Sleep deprivation might contribute to headaches Consider biofeedback. With this mind-body technique, you learn to control  certain bodily functions - such as muscle tension, heart rate and blood pressure - to prevent headaches or reduce headache pain.    Proceed to emergency room if you experience new or worsening symptoms or symptoms do not resolve, if you have new neurologic symptoms or if headache is severe, or for any concerning symptom.    Sarina Ill, MD  [facility] [address] [place],  [address]  Phone [phone] Fax [phone] .

## 2015-01-04 NOTE — ED Provider Notes (Signed)
CSN: [id number]     Arrival date & time 01/04/15  1312 History   First MD Initiated Contact with Patient 01/04/15 1325     Chief Complaint  Patient presents with  . Hypertension     (Consider location/radiation/quality/duration/timing/severity/associated sxs/prior Treatment) The history is provided by the patient and medical records. No language interpreter was used.    Margaret Meyers is a 44 y.o. female  with a hx of HTN, NIDDM, asthma presents to the Emergency Department with complaints of hypertension.  Pt reports when she got to the Neurologist, her SBP was elevated to 200.  Pt reports she is out of her Norvasc and metoprolol for approx 1 month.  Pt was scheduled to see Dr. Inda Castle on Nov 15th, but has not yet had her initial patient appointment.  Pt reports she has a generalized headache described as pressure.  She reports that pressing on the top of her head makes it better.  She reports intermittent headaches like this since 2011 but began to reoccur in July.  She reports her headache is constant and present every day.  She denies sudden onset, thunderclap headache.  Pt reports her mother died from a brain aneurysm.  Pt reports she had a meningioma dx by [facility] neurolog[facility] in 2011.  She denies follow up for this.  Light makes it worse.  Nothing makes it better.  Pt has been taking tylenol and ibuprofen without relief.  Pt also reports blurred vision intermittently for 1 mo. Pt denies diplopia. She reports emesis 2 mornings last week, but no nausea or emesis today.  Patient denies chest pain, shortness of breath, abdominal pain, weakness, dizziness, syncope, dysuria.  Pt reports she was evaluated by neurology prior to being sent to the ED.  No plan was made for further evaluation of the headaches as she felt it was BP related.  Patient reports that her new neurologist was obtaining records from [facility] neurology for further consideration.    Past Medical History  Diagnosis Date  .  Diabetes mellitus without complication ([place])   . Hypertension   . Asthma   . Heart murmur   . Obesity    Past Surgical History  Procedure Laterality Date  . Tubal ligation    . Abdominal hysterectomy    . Leep  1988  . D&c  1992   Family History  Problem Relation Age of Onset  . Cancer Maternal Aunt     Breast  . Hypertension Maternal Grandmother   . Diabetes Neg Hx   . Heart disease Neg Hx   . Hyperlipidemia Neg Hx   . Migraines Neg Hx    Social History  Substance Use Topics  . Smoking status: Never Smoker   . Smokeless tobacco: None  . Alcohol Use: Yes   OB History    Gravida Para Term Preterm AB TAB SAB Ectopic Multiple Living   4 2 2  2  2   2      Review of Systems  Constitutional: Negative for fever, diaphoresis, appetite change, fatigue and unexpected weight change.  HENT: Negative for mouth sores.   Eyes: Positive for visual disturbance.  Respiratory: Negative for cough, chest tightness, shortness of breath and wheezing.   Cardiovascular: Negative for chest pain.  Gastrointestinal: Negative for nausea, vomiting, abdominal pain, diarrhea and constipation.  Endocrine: Negative for polydipsia, polyphagia and polyuria.  Genitourinary: Negative for dysuria, urgency, frequency and hematuria.  Musculoskeletal: Negative for back pain and neck stiffness.  Skin: Negative for rash.  Allergic/Immunologic: Negative for immunocompromised state.  Neurological: Positive for headaches. Negative for syncope and light-headedness.  Hematological: Does not bruise/bleed easily.  Psychiatric/Behavioral: Negative for sleep disturbance. The patient is not nervous/anxious.       Allergies  Penicillins  Home Medications   Prior to Admission medications   Medication Sig Start Date End Date Taking? Authorizing Provider  amLODipine (NORVASC) 10 MG tablet Take 1 tablet (10 mg total) by mouth daily. 01/04/15   Kaimana Lurz, PA-C  glyBURIDE (DIABETA) 5 MG tablet Take 1  tablet (5 mg total) by mouth daily with breakfast. 11/24/14   Emily Filbert, MD  hydrochlorothiazide (HYDRODIURIL) 25 MG tablet Take 0.5 tablets (12.5 mg total) by mouth daily. 11/24/14   Emily Filbert, MD  lisinopril (PRINIVIL,ZESTRIL) 20 MG tablet Take 1 tablet (20 mg total) by mouth daily. 11/24/14   Emily Filbert, MD  metFORMIN (GLUCOPHAGE) 500 MG tablet Take 2 tablets (1,000 mg total) by mouth 2 (two) times daily with a meal. 11/24/14   Emily Filbert, MD  metoprolol (LOPRESSOR) 50 MG tablet Take 1 tablet (50 mg total) by mouth 2 (two) times daily. 01/04/15   Naleah Kofoed, PA-C   BP 175/90 mmHg  Pulse 64  Temp(Src) 98.5 F (36.9 C) (Oral)  Resp 16  Ht 4\' 11"  (1.499 m)  Wt 211 lb (95.709 kg)  BMI 42.59 kg/m2  SpO2 98% Physical Exam  Constitutional: She is oriented to person, place, and time. She appears well-developed and well-nourished. No distress.  Awake, alert, nontoxic appearance  HENT:  Head: Normocephalic and atraumatic.  Mouth/Throat: Oropharynx is clear and moist. No oropharyngeal exudate.  Eyes: Conjunctivae and EOM are normal. Pupils are equal, round, and reactive to light. No scleral icterus.  No horizontal, vertical or rotational nystagmus  Neck: Normal range of motion. Neck supple.  Full active and passive ROM without pain No midline or paraspinal tenderness No nuchal rigidity or meningeal signs  Cardiovascular: Normal rate, regular rhythm, normal heart sounds and intact distal pulses.   No murmur heard. Pulmonary/Chest: Effort normal and breath sounds normal. No respiratory distress. She has no wheezes. She has no rales.  Equal chest expansion  Abdominal: Soft. Bowel sounds are normal. She exhibits no mass. There is no tenderness. There is no rebound and no guarding.  Musculoskeletal: Normal range of motion. She exhibits no edema.  Lymphadenopathy:    She has no cervical adenopathy.  Neurological: She is alert and oriented to person, place, and time. She has normal  reflexes. No cranial nerve deficit. She exhibits normal muscle tone. Coordination normal.  Mental Status:  Alert, oriented, thought content appropriate. Speech fluent without evidence of aphasia. Able to follow 2 step commands without difficulty.  Cranial Nerves:  II:  Peripheral visual fields grossly normal, pupils equal, round, reactive to light III,IV, VI: ptosis not present, extra-ocular motions intact bilaterally  V,VII: smile symmetric, facial light touch sensation equal VIII: hearing grossly normal bilaterally  IX,X: midline uvula rise  XI: bilateral shoulder shrug equal and strong XII: midline tongue extension  Motor:  5/5 in upper and lower extremities bilaterally including strong and equal grip strength and dorsiflexion/plantar flexion Sensory: Pinprick and light touch normal in all extremities.  Deep Tendon Reflexes: 2+ and symmetric  Cerebellar: normal finger-to-nose with bilateral upper extremities Gait: normal gait and balance CV: distal pulses palpable throughout   Skin: Skin is warm and dry. No rash noted. She is not diaphoretic.  Psychiatric: She has a normal mood and affect.  Her behavior is normal. Judgment and thought content normal.  Nursing note and vitals reviewed.   ED Course  Procedures (including critical care time) Labs Review Labs Reviewed  BASIC METABOLIC PANEL - Abnormal; Notable for the following:    Glucose, Bld 302 (*)    Calcium 8.4 (*)    All other components within normal limits  CBG MONITORING, ED - Abnormal; Notable for the following:    Glucose-Capillary 293 (*)    All other components within normal limits  CBC  TROPONIN I    Imaging Review No results found. I have personally reviewed and evaluated these images and lab results as part of my medical decision-making.   EKG Interpretation None       ED ECG REPORT   Date: 01/04/2015  Rate: 69  Rhythm: sinus arrhythmia  QRS Axis: left  Intervals: normal  ST/T Wave abnormalities:  nonspecific ST/T changes  Conduction Disutrbances:left bundle branch block  Narrative Interpretation: Nonischemic ECG, unchanged from previous  Old EKG Reviewed: unchanged  I have personally reviewed the EKG tracing and agree with the computerized printout as noted.   MDM   Final diagnoses:  Nonintractable headache, unspecified chronicity pattern, unspecified headache type  HTN (hypertension), malignant   Margaret Meyers presents with headache and HTN.  Review shows the patient had a normal head CT scan in August 2016. She was recently evaluated in the emergency department for the same headache and headache was improved with a migraine cocktail. Question hypertensive urgency versus headache induced hypertension.  Patient has a normal neurologic exam today. Will give home blood pressure medications and treat her headache and reassess. Labs pending.  4:50 PM Patient reports she feels much better her headache is now a 2/10. She has no further blurred vision.  Her repeat neurologic exam remains normal. She has an elevated blood sugar however no evidence of DKA. Her 12-lead is nonischemic. Her troponin is negative. She has no elevation in BUN and creatinine.  No lab evidence of endorgan damage. Do not believe that this as a hypertensive emergency at this time. Patient requests discharge home and I feel this is reasonable. She is to fill her hypertension medications and follow-up with her primary care physician. Should return precautions given for concerning symptoms such as sudden onset, diplopia, chest pain, shortest breath or other concerns.  Case was discussed with and EKG was reviewed with Dr. Dayna Barker who agrees with the plan.    BP 175/90 mmHg  Pulse 64  Temp(Src) 98.5 F (36.9 C) (Oral)  Resp 16  Ht 4\' 11"  (1.499 m)  Wt 211 lb (95.709 kg)  BMI 42.59 kg/m2  SpO2 98%   Abigail Butts, PA-C 01/04/15 1804  Merrily Pew, MD 01/05/15 1454

## 2015-01-04 NOTE — Telephone Encounter (Signed)
Called pt PCP to get her in sooner than 11/15. Pt has appt scheduled for today at 245pm with Debbrah Alar d/t pt high BP in our office. Pt aware.

## 2015-01-04 NOTE — ED Notes (Signed)
Headache. She was seen by her neurologist for same this am and her BP was elevated. She was advised to come to the ED.

## 2015-01-04 NOTE — Patient Instructions (Signed)
Overall you are doing fairly well but I do want to suggest a few things today:   Remember to drink plenty of fluid, eat healthy meals and do not skip any meals. Try to eat protein with a every meal and eat a healthy snack such as fruit or nuts in between meals. Try to keep a regular sleep-wake schedule and try to exercise daily, particularly in the form of walking, 20-30 minutes a day, if you can.   As far as your medications are concerned, I would like to suggest: restart metoprolol  As far as diagnostic testing: Lab today, will review records from [facility]  I would like to see you back in 3 months, sooner if we need to. Please call us with any interim questions, concerns, problems, updates or refill requests.   Please also call us for any test results so we can go over those with you on the phone.  My clinical assistant and will answer any of your questions and relay your messages to me and also relay most of my messages to you.   Our phone number is [phone]. We also have an after hours call service for urgent matters and there is a physician on-call for urgent questions. For any emergencies you know to call 911 or go to the nearest emergency room

## 2015-01-04 NOTE — Telephone Encounter (Signed)
Pt called back, reading from pharmacy is 210/120.  Advised pt per verbal from PCP to proceed to the ER for management of current BP and call us to arrange routine follow up in our office after the ER visit. Pt voices understanding and is aware that 2:45pm appt for today has been cancelled.

## 2015-01-04 NOTE — Discharge Instructions (Signed)
1. Medications: usual home medications - refills for Norvasc and Lopressor 2. Treatment: rest, drink plenty of fluids, if headache persists take 827mmg ibuprofen with caffeine 3. Follow Up: Please followup with your primary doctor in 3 days and neurology within 1 week for discussion of your diagnoses and further evaluation after today's visit; if you do not have a primary care doctor use the resource guide provided to find one; Please return to the ER for double vision, speech difficulty, gait disturbance, persistent vomiting or other concerns.    Hypertension Hypertension, commonly called high blood pressure, is when the force of blood pumping through your arteries is too strong. Your arteries are the blood vessels that carry blood from your heart throughout your body. A blood pressure reading consists of a higher number over a lower number, such as 110/72. The higher number (systolic) is the pressure inside your arteries when your heart pumps. The lower number (diastolic) is the pressure inside your arteries when your heart relaxes. Ideally you want your blood pressure below 120/80. Hypertension forces your heart to work harder to pump blood. Your arteries may become narrow or stiff. Having untreated or uncontrolled hypertension can cause heart attack, stroke, kidney disease, and other problems. RISK FACTORS Some risk factors for high blood pressure are controllable. Others are not.  Risk factors you cannot control include:   Race. You may be at higher risk if you are African American.  Age. Risk increases with age.  Gender. Men are at higher risk than women before age 71 years. After age 10, women are at higher risk than men. Risk factors you can control include:  Not getting enough exercise or physical activity.  Being overweight.  Getting too much fat, sugar, calories, or salt in your diet.  Drinking too much alcohol. SIGNS AND SYMPTOMS Hypertension does not usually cause signs or  symptoms. Extremely high blood pressure (hypertensive crisis) may cause headache, anxiety, shortness of breath, and nosebleed. DIAGNOSIS To check if you have hypertension, your health care provider will measure your blood pressure while you are seated, with your arm held at the level of your heart. It should be measured at least twice using the same arm. Certain conditions can cause a difference in blood pressure between your right and left arms. A blood pressure reading that is higher than normal on one occasion does not mean that you need treatment. If it is not clear whether you have high blood pressure, you may be asked to return on a different day to have your blood pressure checked again. Or, you may be asked to monitor your blood pressure at home for 1 or more weeks. TREATMENT Treating high blood pressure includes making lifestyle changes and possibly taking medicine. Living a healthy lifestyle can help lower high blood pressure. You may need to change some of your habits. Lifestyle changes may include:  Following the DASH diet. This diet is high in fruits, vegetables, and whole grains. It is low in salt, red meat, and added sugars.  Keep your sodium intake below 2,300 mg per day.  Getting at least 30-45 minutes of aerobic exercise at least 4 times per week.  Losing weight if necessary.  Not smoking.  Limiting alcoholic beverages.  Learning ways to reduce stress. Your health care provider may prescribe medicine if lifestyle changes are not enough to get your blood pressure under control, and if one of the following is true:  You are 27-81 years of age and your systolic blood pressure is  above 140.  You are 74 years of age or older, and your systolic blood pressure is above 150.  Your diastolic blood pressure is above 90.  You have diabetes, and your systolic blood pressure is over 415 or your diastolic blood pressure is over 90.  You have kidney disease and your blood pressure is  above 140/90.  You have heart disease and your blood pressure is above 140/90. Your personal target blood pressure may vary depending on your medical conditions, your age, and other factors. HOME CARE INSTRUCTIONS  Have your blood pressure rechecked as directed by your health care provider.   Take medicines only as directed by your health care provider. Follow the directions carefully. Blood pressure medicines must be taken as prescribed. The medicine does not work as well when you skip doses. Skipping doses also puts you at risk for problems.  Do not smoke.   Monitor your blood pressure at home as directed by your health care provider. SEEK MEDICAL CARE IF:   You think you are having a reaction to medicines taken.  You have recurrent headaches or feel dizzy.  You have swelling in your ankles.  You have trouble with your vision. SEEK IMMEDIATE MEDICAL CARE IF:  You develop a severe headache or confusion.  You have unusual weakness, numbness, or feel faint.  You have severe chest or abdominal pain.  You vomit repeatedly.  You have trouble breathing. MAKE SURE YOU:   Understand these instructions.  Will watch your condition.  Will get help right away if you are not doing well or get worse.   This information is not intended to replace advice given to you by your health care provider. Make sure you discuss any questions you have with your health care provider.   Document Released: 02/27/2005 Document Revised: 07/14/2014 Document Reviewed: 12/20/2012 Elsevier Interactive Patient Education [employer].

## 2015-01-04 NOTE — Telephone Encounter (Signed)
Pt was scheduled by Neurology to see [place] today for elevated BP (205/110). Per verbal from PCP, have pt recheck BP at home.  If still over 960 systolic, then pt needs to be treated in the ER. Spoke with pt at 11:20. She was not home yet and states she will have BP checked at her pharmacy and call me back with reading.

## 2015-01-05 LAB — BASIC METABOLIC PANEL
BUN/Creatinine Ratio: 18 (ref 9–23)
BUN: 12 mg/dL (ref 6–24)
CHLORIDE: 95 mmol/L — AB (ref 97–106)
CO2: 25 mmol/L (ref 18–29)
CREATININE: 0.68 mg/dL (ref 0.57–1.00)
Calcium: 8.6 mg/dL — ABNORMAL LOW (ref 8.7–10.2)
GFR calc Af Amer: 122 mL/min/{1.73_m2} (ref >59)
GFR calc non Af Amer: 106 mL/min/{1.73_m2} (ref >59)
GLUCOSE: 269 mg/dL — AB (ref 65–99)
Potassium: 4 mmol/L (ref 3.5–5.2)
SODIUM: 139 mmol/L (ref 136–144)

## 2015-01-06 ENCOUNTER — Telehealth: Payer: Self-pay | Admitting: *Deleted

## 2015-01-06 NOTE — Telephone Encounter (Signed)
-----  Message from Melvenia Beam, MD sent at 01/05/2015  8:51 PM EDT ----- Let patient know her glucose was very high, 269, on her bmp. i hop eshe met with her pcp about her BP and glucose thanks

## 2015-01-06 NOTE — Telephone Encounter (Signed)
I LMVM for pt on her mobile that labs came back, relayed via VM that her glucose was high. ( I noted: in EPIC that:    She did go to ED 01-04-15 about her Bp (received migraine cocktail) no other treatment noted on ED notes.  Will f/u with her pcp.  Pt is to call back if questions.

## 2015-01-14 ENCOUNTER — Telehealth: Payer: Self-pay | Admitting: *Deleted

## 2015-01-14 NOTE — Telephone Encounter (Signed)
Request faxed on 01/14/15 requesting reports.

## 2015-01-18 ENCOUNTER — Telehealth: Payer: Self-pay | Admitting: *Deleted

## 2015-01-18 NOTE — Telephone Encounter (Signed)
Receive notes on 01/18/15 from [facility] notes on [place] desk.

## 2015-01-26 ENCOUNTER — Encounter: Payer: Self-pay | Admitting: Family

## 2015-01-26 ENCOUNTER — Ambulatory Visit (INDEPENDENT_AMBULATORY_CARE_PROVIDER_SITE_OTHER): Payer: PRIVATE HEALTH INSURANCE | Admitting: Family

## 2015-01-26 VITALS — BP 151/98 | HR 78 | Temp 98.1°F | Resp 18 | Ht 59.0 in | Wt 210.0 lb

## 2015-01-26 DIAGNOSIS — E669 Obesity, unspecified: Secondary | ICD-10-CM

## 2015-01-26 DIAGNOSIS — E1165 Type 2 diabetes mellitus with hyperglycemia: Secondary | ICD-10-CM

## 2015-01-26 DIAGNOSIS — R221 Localized swelling, mass and lump, neck: Secondary | ICD-10-CM | POA: Diagnosis not present

## 2015-01-26 DIAGNOSIS — R51 Headache: Secondary | ICD-10-CM

## 2015-01-26 DIAGNOSIS — R8290 Unspecified abnormal findings in urine: Secondary | ICD-10-CM | POA: Diagnosis not present

## 2015-01-26 DIAGNOSIS — E119 Type 2 diabetes mellitus without complications: Secondary | ICD-10-CM

## 2015-01-26 DIAGNOSIS — IMO0001 Reserved for inherently not codable concepts without codable children: Secondary | ICD-10-CM

## 2015-01-26 DIAGNOSIS — R519 Headache, unspecified: Secondary | ICD-10-CM

## 2015-01-26 DIAGNOSIS — I10 Essential (primary) hypertension: Secondary | ICD-10-CM

## 2015-01-26 LAB — URINALYSIS, ROUTINE W REFLEX MICROSCOPIC
Bilirubin Urine: NEGATIVE
Hgb urine dipstick: NEGATIVE
KETONES UR: 15 — AB
Leukocytes, UA: NEGATIVE
NITRITE: NEGATIVE
PH: 5.5 (ref 5.0–8.0)
RBC / HPF: NONE SEEN (ref 0–?)
TOTAL PROTEIN, URINE-UPE24: NEGATIVE
UROBILINOGEN UA: 0.2 (ref 0.0–1.0)
Urine Glucose: >=1000 — AB

## 2015-01-26 LAB — LIPID PANEL
CHOL/HDL RATIO: 5
CHOLESTEROL: 173 mg/dL (ref 0–200)
HDL: 34.8 mg/dL — ABNORMAL LOW (ref >39.00)
NONHDL: 138.05
TRIGLYCERIDES: 222 mg/dL — AB (ref 0.0–149.0)
VLDL: 44.4 mg/dL — AB (ref 0.0–40.0)

## 2015-01-26 LAB — TSH: TSH: 3.94 u[IU]/mL (ref 0.35–4.50)

## 2015-01-26 LAB — LDL CHOLESTEROL, DIRECT: LDL DIRECT: 97 mg/dL

## 2015-01-26 LAB — MICROALBUMIN / CREATININE URINE RATIO
CREATININE, U: 179.4 mg/dL
MICROALB UR: 7.8 mg/dL — AB (ref 0.0–1.9)
MICROALB/CREAT RATIO: 4.3 mg/g (ref 0.0–30.0)

## 2015-01-26 LAB — HEPATIC FUNCTION PANEL
ALBUMIN: 3.6 g/dL (ref 3.5–5.2)
ALT: 13 U/L (ref 0–35)
AST: 9 U/L (ref 0–37)
Alkaline Phosphatase: 85 U/L (ref 39–117)
Bilirubin, Direct: 0 mg/dL (ref 0.0–0.3)
TOTAL PROTEIN: 6.8 g/dL (ref 6.0–8.3)
Total Bilirubin: 0.4 mg/dL (ref 0.2–1.2)

## 2015-01-26 LAB — BASIC METABOLIC PANEL
BUN: 18 mg/dL (ref 6–23)
CALCIUM: 9.2 mg/dL (ref 8.4–10.5)
CO2: 29 meq/L (ref 19–32)
CREATININE: 0.75 mg/dL (ref 0.40–1.20)
Chloride: 100 mEq/L (ref 96–112)
GFR: 107.04 mL/min (ref >60.00)
GLUCOSE: 287 mg/dL — AB (ref 70–99)
Potassium: 3.9 mEq/L (ref 3.5–5.1)
Sodium: 136 mEq/L (ref 135–145)

## 2015-01-26 LAB — HEMOGLOBIN A1C: Hgb A1c MFr Bld: 12.2 % — ABNORMAL HIGH (ref 4.6–6.5)

## 2015-01-26 MED ORDER — SITAGLIPTIN PHOSPHATE 100 MG PO TABS: 100.0000 mg | ORAL_TABLET | Freq: Every day | ORAL | Status: AC

## 2015-01-26 MED ORDER — METOPROLOL TARTRATE 50 MG PO TABS: 75.0000 mg | ORAL_TABLET | Freq: Two times a day (BID) | ORAL | Status: DC

## 2015-01-26 NOTE — Assessment & Plan Note (Signed)
BP Readings from Last 3 Encounters:  01/26/15 151/98  01/04/15 175/90  01/04/15 205/110   BP is coming down. Still above goal. Advised pt to increase metoprolol from 50mg  bid to 75mg  bid. Continue other BP meds.

## 2015-01-26 NOTE — Assessment & Plan Note (Signed)
Discussed importance of low sodium/diabetic diet, exercise, weight loss.

## 2015-01-26 NOTE — Assessment & Plan Note (Signed)
HA's are improving since her BP has been better controlled.

## 2015-01-26 NOTE — Progress Notes (Signed)
Pre visit review using our clinic review tool, if applicable. No additional management support is needed unless otherwise documented below in the visit note. 

## 2015-01-26 NOTE — Progress Notes (Addendum)
Subjective:    Patient ID: Margaret Meyers, female    DOB: 1970-10-13, 44 y.o.   MRN: [id number]  HPI  Margaret Meyers is a 44 yr old female who presents today to establish care.  HTN-  Currently maintained on hctz 25mg , lisinopril 20mg , metoprolol 50mg  and amlodipine  BP today is 151/98.  +  Hx of brain tumor- Pt reports that she has was told by a neurologist in [place] that she had a brain tumor 2 years ago. She did have a non-contrast CT of the brain performed by a local neurologist on 10/30/14 which did not reveal brain tumor.  Her neurologist made a note that they have requested records from her [facility] neurologist for review.    DM2-  Has not had a1c checked x 2 years due to lack of insurance. Reports that her sugars usually were around 250-260.   High about 1 month ago was 445 and went to the ED.  Metformin causes diarrhea and nausea. She does take glyburide.    Headaches-  Overall headaches are better.    She reports feeling more emotional recently. Wants to to cry all the time.  Does not feel down.  Does not feel hopeless. Describes feeling frustrated that she is not meeting all the goals that she has set out for herself.    Review of Systems  Constitutional: Negative for unexpected weight change.  HENT: Negative for hearing loss and rhinorrhea.   Eyes: Negative for visual disturbance.  Respiratory: Negative for cough and shortness of breath.   Cardiovascular: Negative for leg swelling.  Gastrointestinal: Negative for nausea, vomiting and diarrhea.  Genitourinary: Negative for dysuria and frequency.  Musculoskeletal: Negative for arthralgias.  Skin: Negative for rash.  Neurological: Positive for headaches.  Hematological: Negative for adenopathy.  Psychiatric/Behavioral:       See HPI       Past Medical History  Diagnosis Date  . Diabetes mellitus without complication ([place])   . Hypertension   . Asthma   . Heart murmur   . Obesity   . Migraine     Social History    Social History  . Marital Status: Single    Spouse Name: N/A  . Number of Children: 1  . Years of Education: 12+   Occupational History  . [employer]    Social History Main Topics  . Smoking status: Never Smoker   . Smokeless tobacco: Not on file  . Alcohol Use: Yes     Comment: occasional (holidays)  . Drug Use: No  . Sexual Activity: Yes    Birth Control/ Protection: Surgical   Other Topics Concern  . 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 Tubal ligation    . Leep  1988  . D&c  1992  . Abdominal hysterectomy  2009  . Abscess drainage  2009    after hysterectomy    Family History  Problem Relation Age of Onset  . Cancer Maternal Aunt     Breast  . Hypertension Maternal Grandmother   . Diabetes Neg Hx   . Heart disease Neg Hx   . Hyperlipidemia Neg Hx   .  Migraines Neg Hx   . Aneurysm Mother     brain x 4    Allergies  Allergen Reactions  . Penicillins Hives    Has patient had a PCN reaction causing immediate rash, facial/tongue/throat swelling, SOB or lightheadedness with hypotension: Yes Has patient had a PCN reaction causing severe rash involving mucus membranes or skin necrosis: No  Has patient had a PCN reaction that required hospitalization: No  Has patient had a PCN reaction occurring within the last 10 years: No     Current Outpatient Prescriptions on File Prior to Visit  Medication Sig Dispense Refill  . amLODipine (NORVASC) 10 MG tablet Take 1 tablet (10 mg total) by mouth daily. 30 tablet 0  . glyBURIDE (DIABETA) 5 MG tablet Take 1 tablet (5 mg total) by mouth daily with breakfast. 30 tablet 0  . hydrochlorothiazide (HYDRODIURIL) 25 MG  tablet Take 0.5 tablets (12.5 mg total) by mouth daily. 30 tablet 0  . lisinopril (PRINIVIL,ZESTRIL) 20 MG tablet Take 1 tablet (20 mg total) by mouth daily. 30 tablet 0  . [DISCONTINUED] beclomethasone (QVAR) 40 MCG/ACT inhaler Inhale 2 puffs into the lungs 2 (two) times daily. (Patient not taking: Reported on 11/23/2014) 1 Inhaler 0   No current facility-administered medications on file prior to visit.    BP 151/98 mmHg  Pulse 78  Temp(Src) 98.1 F (36.7 C) (Oral)  Resp 18  Ht 4\' 11"  (1.499 m)  Wt 210 lb (95.255 kg)  BMI 42.39 kg/m2  SpO2 98%    Objective:   Physical Exam  Constitutional: She is oriented to person, place, and time. She appears well-developed and well-nourished.  HENT:  Head: Normocephalic and atraumatic.  Eyes: No scleral icterus.  Cardiovascular: Normal rate, regular rhythm and normal heart sounds.   No murmur heard. Pulmonary/Chest: Effort normal and breath sounds normal. No respiratory distress. She has no wheezes.  Abdominal: Soft. She exhibits no distension. There is no tenderness.  Musculoskeletal: She exhibits no edema.  Lymphadenopathy:    She has no cervical adenopathy.  Neurological: She is alert and oriented to person, place, and time.  Skin: Skin is warm and dry.  Psychiatric: She has a normal mood and affect. Her behavior is normal. Judgment and thought content normal.          Assessment & Plan:  Declines pneumovax and flu shots today

## 2015-01-26 NOTE — Assessment & Plan Note (Signed)
Uncontrolled. Intolerant to metformin. D/C metformin, start januvia, continue glyburide. Obtain urine microalbumin. She reports strong smelling urine- will also send UA and culture.

## 2015-01-26 NOTE — Patient Instructions (Signed)
Stop metformin, start januvia.  Increase metoprolol to 1.5 tabs twice daily. Complete lab work prior to leaving.

## 2015-01-27 ENCOUNTER — Telehealth: Payer: Self-pay | Admitting: *Deleted

## 2015-01-27 NOTE — Telephone Encounter (Signed)
-----   Message from Debbrah Alar, NP sent at 01/26/2015 12:02 PM EST ----- Pt is requesting lancets for accucheck, could you please send?

## 2015-01-27 NOTE — Telephone Encounter (Signed)
Attempted to reach pt and verify type of lancet and meter that she uses and left message to check mychart account. Message sent.

## 2015-01-28 ENCOUNTER — Telehealth: Payer: Self-pay | Admitting: Family

## 2015-01-28 LAB — URINE CULTURE

## 2015-01-28 MED ORDER — NITROFURANTOIN MONOHYD MACRO 100 MG PO CAPS: 100.0000 mg | ORAL_CAPSULE | Freq: Two times a day (BID) | ORAL | Status: DC

## 2015-01-28 NOTE — Telephone Encounter (Signed)
Notified pt and she voices understanding. Pt is willing to work on diet and medication as discussed at visit.

## 2015-01-28 NOTE — Telephone Encounter (Signed)
Please let pt know that urine shows bacteria. I would like her to start macrodantin for UTI.   A1C is elevated as expected indicating poorly controlled sugar in the past 3 months.  Kidneys show protein likely due to damage from the diabetes and high blood pressure.  Start [place] as we discussed at her visit.  Thyroid looks good. Please continue to work on diabetic diet, exercise and weight loss.

## 2015-01-30 ENCOUNTER — Ambulatory Visit (HOSPITAL_BASED_OUTPATIENT_CLINIC_OR_DEPARTMENT_OTHER)
Admission: RE | Admit: 2015-01-30 | Discharge: 2015-01-30 | Disposition: A | Discharge status: Home / Self Care | Payer: PRIVATE HEALTH INSURANCE | Source: Ambulatory Visit | Attending: Family | Admitting: Family

## 2015-01-30 DIAGNOSIS — R221 Localized swelling, mass and lump, neck: Secondary | ICD-10-CM | POA: Diagnosis present

## 2015-01-31 ENCOUNTER — Telehealth: Payer: Self-pay | Admitting: Family

## 2015-01-31 DIAGNOSIS — R221 Localized swelling, mass and lump, neck: Secondary | ICD-10-CM

## 2015-01-31 NOTE — Telephone Encounter (Signed)
Please let pt know that I reviewed her CT scan.  Mass at base of her neck could be fatty tissue, but the radiologist would like [place] to complete CT neck with contrast to take a closer look.

## 2015-02-02 ENCOUNTER — Ambulatory Visit (HOSPITAL_BASED_OUTPATIENT_CLINIC_OR_DEPARTMENT_OTHER)
Admission: RE | Admit: 2015-02-02 | Discharge: 2015-02-02 | Disposition: A | Discharge status: Home / Self Care | Payer: PRIVATE HEALTH INSURANCE | Source: Ambulatory Visit | Attending: Family | Admitting: Family

## 2015-02-02 DIAGNOSIS — D170 Benign lipomatous neoplasm of skin and subcutaneous tissue of head, face and neck: Secondary | ICD-10-CM | POA: Diagnosis not present

## 2015-02-02 DIAGNOSIS — R221 Localized swelling, mass and lump, neck: Secondary | ICD-10-CM | POA: Insufficient documentation

## 2015-02-02 MED ORDER — IOHEXOL 300 MG/ML  SOLN
75.0000 mL | Freq: Once | INTRAMUSCULAR | Status: AC | PRN
  Administered 2015-02-02: 75 mL via INTRAVENOUS

## 2015-02-02 NOTE — Telephone Encounter (Signed)
Left detailed message on pt's cell# and to call if any questions, order signed.

## 2015-02-15 ENCOUNTER — Other Ambulatory Visit: Payer: Self-pay | Admitting: Obstetrics & Gynecology

## 2015-02-15 ENCOUNTER — Encounter: Payer: Self-pay | Admitting: Family

## 2015-02-15 MED ORDER — METOPROLOL TARTRATE 50 MG PO TABS: 100.0000 mg | ORAL_TABLET | Freq: Two times a day (BID) | ORAL | Status: DC

## 2015-02-15 MED ORDER — CANAGLIFLOZIN 100 MG PO TABS: 100.0000 mg | ORAL_TABLET | Freq: Every day | ORAL | Status: AC

## 2015-02-15 NOTE — Telephone Encounter (Signed)
Notified pt and she voices understanding and scheduled appt for Friday at 8:15am. Below directions for metoprolol say 100mg  once a day but pended Rx stated 100mg  twice a day. I advised pt to take 100mg  twice a day.  Please let me know if that is not correct. Also advised pt to call for earlier appt if symptoms worsen and she voices understanding.

## 2015-02-15 NOTE — Telephone Encounter (Signed)
Please advise pt to continue current meds, add invokana 100mg  once daily for blood pressure.  Increase toprol xl to 100mg  (two 50 mg tabs) once daily. Please schedule patient for follow up in our office.

## 2015-02-16 NOTE — Telephone Encounter (Signed)
Letter completed and placed at front desk for pick up; message sent to pt.

## 2015-02-16 NOTE — Telephone Encounter (Signed)
Should be metoprolol 100mg  bid.  OK to provide letter for work re: need for frequent bathroom breaks. thanks

## 2015-02-17 ENCOUNTER — Encounter (HOSPITAL_BASED_OUTPATIENT_CLINIC_OR_DEPARTMENT_OTHER): Payer: Self-pay

## 2015-02-17 ENCOUNTER — Emergency Department (HOSPITAL_BASED_OUTPATIENT_CLINIC_OR_DEPARTMENT_OTHER)
Admission: EM | Admit: 2015-02-17 | Discharge: 2015-02-17 | Discharge status: Against Medical Advice | Payer: PRIVATE HEALTH INSURANCE | Attending: Emergency Medicine | Admitting: Emergency Medicine

## 2015-02-17 DIAGNOSIS — J45909 Unspecified asthma, uncomplicated: Secondary | ICD-10-CM | POA: Insufficient documentation

## 2015-02-17 DIAGNOSIS — I10 Essential (primary) hypertension: Secondary | ICD-10-CM | POA: Insufficient documentation

## 2015-02-17 DIAGNOSIS — E119 Type 2 diabetes mellitus without complications: Secondary | ICD-10-CM | POA: Diagnosis not present

## 2015-02-17 DIAGNOSIS — R42 Dizziness and giddiness: Secondary | ICD-10-CM | POA: Diagnosis present

## 2015-02-17 DIAGNOSIS — R011 Cardiac murmur, unspecified: Secondary | ICD-10-CM | POA: Diagnosis not present

## 2015-02-17 DIAGNOSIS — E669 Obesity, unspecified: Secondary | ICD-10-CM | POA: Insufficient documentation

## 2015-02-17 NOTE — ED Notes (Signed)
C/o intermittent light headed since yesterday-states she was in the building to pick up rx and decided to be seen and have BP checked-pt NAD-steady gait

## 2015-02-19 ENCOUNTER — Ambulatory Visit: Payer: PRIVATE HEALTH INSURANCE | Admitting: Family

## 2015-02-23 ENCOUNTER — Ambulatory Visit (INDEPENDENT_AMBULATORY_CARE_PROVIDER_SITE_OTHER): Payer: PRIVATE HEALTH INSURANCE | Admitting: Family

## 2015-02-23 ENCOUNTER — Encounter: Payer: Self-pay | Admitting: Family

## 2015-02-23 VITALS — BP 138/100 | HR 90 | Temp 98.3°F | Resp 16 | Ht <= 58 in | Wt 212.2 lb

## 2015-02-23 DIAGNOSIS — Z23 Encounter for immunization: Secondary | ICD-10-CM

## 2015-02-23 DIAGNOSIS — E119 Type 2 diabetes mellitus without complications: Secondary | ICD-10-CM | POA: Diagnosis not present

## 2015-02-23 DIAGNOSIS — I10 Essential (primary) hypertension: Secondary | ICD-10-CM | POA: Diagnosis not present

## 2015-02-23 NOTE — Progress Notes (Signed)
Pre visit review using our clinic review tool, if applicable. No additional management support is needed unless otherwise documented below in the visit note. 

## 2015-02-23 NOTE — Patient Instructions (Signed)
Please follow up in 3 weeks- bring your sugar log with you.

## 2015-02-23 NOTE — Progress Notes (Signed)
Subjective:    Patient ID: Margaret Meyers, female    DOB: [birth year], 44 y.o.   MRN: [id number]  HPI  Margaret Meyers is a 44 yr old female who presents today for follow up.  1) DM2- Reports + nausea.  Reports + med compliance.  Reports sugars running around 210 fasting.  Just started Thursday/friday (invokana). Started walking.  Has walked 6 miles las few days.   Lab Results  Component Value Date   HGBA1C 12.2* 01/26/2015   Lab Results  Component Value Date   MICROALBUR 7.8* 01/26/2015   CREATININE 0.75 01/26/2015   2) HTN- She reports that she did not take her AM meds (usually takes at 10 AM).   BP Readings from Last 3 Encounters:  02/23/15 138/100  02/17/15 189/104  01/26/15 151/98      Review of Systems    see HPI  Past Medical History  Diagnosis Date  . Diabetes mellitus without complication ([place])   . Hypertension   . Asthma   . Heart murmur   . Obesity   . Migraine     Social History   Social History  . Marital Status: Single    Spouse Name: N/A  . Number of Children: 1  . Years of Education: 12+   Occupational History  . [employer]    Social History Main Topics  . Smoking status: Never Smoker   . Smokeless tobacco: Not on file  . Alcohol Use: Yes     Comment: occasional (holidays)  . Drug Use: No  . Sexual Activity: Yes    Birth Control/ Protection: Surgical   Other Topics Concern  . 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 Tubal ligation    . Leep  1988  . D&c  1992  . Abdominal hysterectomy  2009  . Abscess drainage  2009    after hysterectomy    Family History  Problem Relation Age of Onset  . Cancer Maternal Aunt     Breast  .  Hypertension Maternal Grandmother   . Diabetes Neg Hx   . Heart disease Neg Hx   . Hyperlipidemia Neg Hx   . Migraines Neg Hx   . Aneurysm Mother     brain x 4    Allergies  Allergen Reactions  . Penicillins Hives    Has patient had a PCN reaction causing immediate rash, facial/tongue/throat swelling, SOB or lightheadedness with hypotension: Yes Has patient had a PCN reaction causing severe rash involving mucus membranes or skin necrosis: No  Has patient had a PCN reaction that required hospitalization: No  Has patient had a PCN reaction occurring within the last 10 years: No     Current Outpatient Prescriptions on File Prior to Visit  Medication Sig Dispense Refill  . amLODipine (NORVASC) 10 MG tablet Take 1 tablet (10 mg total) by mouth daily. 30 tablet 0  . canagliflozin (INVOKANA) 100 MG TABS tablet Take 1 tablet (100 mg total) by mouth daily before breakfast. 30 tablet 2  . glyBURIDE (DIABETA) 5 MG tablet Take 1 tablet (5 mg total) by mouth daily with breakfast. 30 tablet 0  .  hydrochlorothiazide (HYDRODIURIL) 25 MG tablet Take 0.5 tablets (12.5 mg total) by mouth daily. 30 tablet 0  . metoprolol (LOPRESSOR) 50 MG tablet Take 2 tablets (100 mg total) by mouth 2 (two) times daily. 60 tablet 0  . nitrofurantoin, macrocrystal-monohydrate, (MACROBID) 100 MG capsule Take 1 capsule (100 mg total) by mouth 2 (two) times daily. 14 capsule 0  . sitaGLIPtin (JANUVIA) 100 MG tablet Take 1 tablet (100 mg total) by mouth daily. 30 tablet 5  . [DISCONTINUED] beclomethasone (QVAR) 40 MCG/ACT inhaler Inhale 2 puffs into the lungs 2 (two) times daily. (Patient not taking: Reported on 11/23/2014) 1 Inhaler 0   No current facility-administered medications on file prior to visit.    BP 138/100 mmHg  Pulse 90  Temp(Src) 98.3 F (36.8 C) (Oral)  Resp 16  Ht 4\' 8"  (1.422 m)  Wt 212 lb 3.2 oz (96.253 kg)  BMI 47.60 kg/m2    Objective:   Physical Exam  Constitutional: She appears  well-developed and well-nourished.  Cardiovascular: Normal rate, regular rhythm and normal heart sounds.   No murmur heard. Pulmonary/Chest: Effort normal and breath sounds normal. No respiratory distress. She has no wheezes.  Psychiatric: She has a normal mood and affect. Her behavior is normal. Judgment and thought content normal.          Assessment & Plan:

## 2015-02-24 ENCOUNTER — Encounter: Payer: Self-pay | Admitting: Family

## 2015-02-24 ENCOUNTER — Telehealth: Payer: Self-pay | Admitting: Family

## 2015-02-24 ENCOUNTER — Telehealth: Payer: Self-pay | Admitting: *Deleted

## 2015-02-24 MED ORDER — LISINOPRIL 20 MG PO TABS: 20.0000 mg | ORAL_TABLET | Freq: Every day | ORAL | Status: DC

## 2015-02-24 NOTE — Telephone Encounter (Signed)
Received request from [employer] requesting specific times and quantity of bathroom breaks pt should be allowed based on her medical condition. PCP completed new letter and faxed to [phone].

## 2015-02-24 NOTE — Telephone Encounter (Signed)
Receive fax from pharmacy requesting refill of lisinopril. Refill sent.

## 2015-02-24 NOTE — Telephone Encounter (Signed)
No charge. 

## 2015-02-24 NOTE — Telephone Encounter (Signed)
Date/Time of No Show: 02/19/15 8:15am Type of Appt: follow up Rescheduled: came in 1:45pm same day # NS in last year: 1  Charge or No Charge?

## 2015-02-28 NOTE — Assessment & Plan Note (Signed)
BP up today, reminded pt on importance of med compliance, continue current meds.

## 2015-02-28 NOTE — Assessment & Plan Note (Signed)
Commended pt on her walking. Sugars seem to be improving, but still above goal. Will see how she does now that she is on invokana.

## 2015-03-16 ENCOUNTER — Ambulatory Visit (INDEPENDENT_AMBULATORY_CARE_PROVIDER_SITE_OTHER): Payer: PRIVATE HEALTH INSURANCE | Admitting: Family

## 2015-03-16 ENCOUNTER — Encounter: Payer: Self-pay | Admitting: Family

## 2015-03-16 ENCOUNTER — Telehealth: Payer: Self-pay

## 2015-03-16 VITALS — BP 170/104 | HR 85 | Temp 98.4°F | Resp 18 | Ht <= 58 in | Wt 215.8 lb

## 2015-03-16 DIAGNOSIS — I10 Essential (primary) hypertension: Secondary | ICD-10-CM | POA: Diagnosis not present

## 2015-03-16 DIAGNOSIS — E119 Type 2 diabetes mellitus without complications: Secondary | ICD-10-CM

## 2015-03-16 MED ORDER — INSULIN DETEMIR 100 UNIT/ML FLEXPEN
PEN_INJECTOR | SUBCUTANEOUS | Status: DC
Start: 2015-03-16 — End: 2015-03-23

## 2015-03-16 MED ORDER — INSULIN PEN NEEDLE 32G X 4 MM MISC: Status: DC

## 2015-03-16 MED ORDER — INSULIN DETEMIR 100 UNIT/ML FLEXPEN: PEN_INJECTOR | SUBCUTANEOUS | Status: DC

## 2015-03-16 MED ORDER — HYDROCHLOROTHIAZIDE 25 MG PO TABS: 12.5000 mg | ORAL_TABLET | Freq: Every day | ORAL | Status: DC

## 2015-03-16 NOTE — Progress Notes (Signed)
Pre visit review using our clinic review tool, if applicable. No additional management support is needed unless otherwise documented below in the visit note. 

## 2015-03-16 NOTE — Patient Instructions (Addendum)
Please start levemir 5 units injected each night at bedtime. Check fasting sugars once daily and email me your readings in mychart in 1 week for further instructions. Call us if you develop sugars <80 after beginning insulin.

## 2015-03-16 NOTE — Telephone Encounter (Signed)
-----   Message from Emily Filbert, MD sent at 03/15/2015  4:03 PM EST ----- She can have 2 months refill of her BP med but she needs to get them filled by her fam doc in the future. Thanks

## 2015-03-16 NOTE — Assessment & Plan Note (Signed)
Uncontrolled, pt out of lisinopril, she will resume.  Continue metoprolol and hctz.

## 2015-03-16 NOTE — Assessment & Plan Note (Addendum)
Fasting sugars above goal despite maximizing oral medications. Will initiate low dose levemir 5 units once daily with plan to titrate upward pending glucose readings. Pt was instructed on proper insulin dosing by CMA.

## 2015-03-16 NOTE — Progress Notes (Signed)
Subjective:    Patient ID: Margaret Meyers, female    DOB: 10-01-1970, 45 y.o.   MRN: [id number]  HPI  Margaret Meyers is a 45 yr old female who presents today for follow up.  HTN- Last visit she had not been compliant with her medications.  She is maintained on metoprolol, hctz. Ran out of lisinopril 3 days ago.   BP Readings from Last 3 Encounters:  03/16/15 170/104  02/23/15 138/100  02/17/15 189/104     DM2- Invokana was added to her regimen on 02/15/15. She sends [place] her blood sugar readings via mychart-  Sugars 167- 262, generally around 175-200. Current meds include invokana, glyburide, januvia.  Sugars are fasting.   Lab Results  Component Value Date   HGBA1C 12.2* 01/26/2015   Lab Results  Component Value Date   MICROALBUR 7.8* 01/26/2015   CREATININE 0.75 01/26/2015    Review of Systems    see HPI  Past Medical History  Diagnosis Date  . Diabetes mellitus without complication ([place])   . Hypertension   . Asthma   . Heart murmur   . Obesity   . Migraine     Social History   Social History  . Marital Status: Single    Spouse Name: N/A  . Number of Children: 1  . Years of Education: 12+   Occupational History  . [employer]    Social History Main Topics  . Smoking status: Never Smoker   . Smokeless tobacco: Not on file  . Alcohol Use: Yes     Comment: occasional (holidays)  . Drug Use: No  . Sexual Activity: Yes    Birth Control/ Protection: Surgical   Other Topics Concern  . 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 Tubal ligation    . Leep  1988  . D&c  1992  . Abdominal hysterectomy  2009  . Abscess drainage  2009    after hysterectomy     Family History  Problem Relation Age of Onset  . Cancer Maternal Aunt     Breast  . Hypertension Maternal Grandmother   . Diabetes Neg Hx   . Heart disease Neg Hx   . Hyperlipidemia Neg Hx   . Migraines Neg Hx   . Aneurysm Mother     brain x 4    Allergies  Allergen Reactions  . Penicillins Hives    Has patient had a PCN reaction causing immediate rash, facial/tongue/throat swelling, SOB or lightheadedness with hypotension: Yes Has patient had a PCN reaction causing severe rash involving mucus membranes or skin necrosis: No  Has patient had a PCN reaction that required hospitalization: No  Has patient had a PCN reaction occurring within the last 10 years: No     Current Outpatient Prescriptions on File Prior to Visit  Medication Sig Dispense Refill  . amLODipine (NORVASC) 10 MG tablet Take 1 tablet (10 mg total) by mouth daily. 30 tablet 0  . canagliflozin (INVOKANA) 100 MG TABS tablet Take 1 tablet (100 mg total) by mouth daily before breakfast. 30 tablet 2  . glyBURIDE (DIABETA) 5 MG tablet Take 1 tablet (5  mg total) by mouth daily with breakfast. 30 tablet 0  . lisinopril (PRINIVIL,ZESTRIL) 20 MG tablet Take 1 tablet (20 mg total) by mouth daily. 30 tablet 5  . metoprolol (LOPRESSOR) 50 MG tablet Take 2 tablets (100 mg total) by mouth 2 (two) times daily. 60 tablet 0  . sitaGLIPtin (JANUVIA) 100 MG tablet Take 1 tablet (100 mg total) by mouth daily. 30 tablet 5  . [DISCONTINUED] beclomethasone (QVAR) 40 MCG/ACT inhaler Inhale 2 puffs into the lungs 2 (two) times daily. (Patient not taking: Reported on 11/23/2014) 1 Inhaler 0   No current facility-administered medications on file prior to visit.    Pulse 85  Temp(Src) 98.4 F (36.9 C) (Oral)  Resp 18  Ht 4\' 8"  (1.422 m)  Wt 215 lb 12.8 oz (97.886 kg)  BMI 48.41 kg/m2  SpO2 100%    Objective:   Physical Exam  Constitutional: She is oriented to person, place, and time. She appears well-developed and  well-nourished.  HENT:  Head: Normocephalic and atraumatic.  Cardiovascular: Normal rate, regular rhythm and normal heart sounds.   No murmur heard. Pulmonary/Chest: Effort normal and breath sounds normal. No respiratory distress. She has no wheezes.  Musculoskeletal: She exhibits no edema.  Neurological: She is alert and oriented to person, place, and time.  Psychiatric: She has a normal mood and affect. Her behavior is normal. Judgment and thought content normal.          Assessment & Plan:

## 2015-03-23 ENCOUNTER — Encounter: Payer: Self-pay | Admitting: Family

## 2015-03-23 MED ORDER — FLUCONAZOLE 150 MG PO TABS: ORAL_TABLET | ORAL | Status: DC

## 2015-03-23 MED ORDER — AMLODIPINE BESYLATE 10 MG PO TABS: 10.0000 mg | ORAL_TABLET | Freq: Every day | ORAL | Status: DC

## 2015-03-23 MED ORDER — HYDROCHLOROTHIAZIDE 25 MG PO TABS: 12.5000 mg | ORAL_TABLET | Freq: Every day | ORAL | Status: DC

## 2015-03-23 MED ORDER — INSULIN DETEMIR 100 UNIT/ML FLEXPEN: PEN_INJECTOR | SUBCUTANEOUS | Status: DC

## 2015-03-23 MED FILL — AMLODIPINE BESYLATE 10 MG T: 10 | 30 days supply | Qty: 30 | Fill #0

## 2015-03-23 MED FILL — FLUCONAZOLE 150 MG TABLET: 150 | 3 days supply | Qty: 2 | Fill #0

## 2015-03-23 NOTE — Telephone Encounter (Signed)
Margaret Meyers-- I sent refill of amlodipine. Pt should still have refills of lisinopril and HCTZ at her pharmacy. Please advise re: above BS readings and yeast infection?

## 2015-04-06 ENCOUNTER — Telehealth: Payer: Self-pay | Admitting: Family

## 2015-04-06 NOTE — Telephone Encounter (Signed)
Called to follow up with patient.  Says she went to [facility] on [address] and say Chipper Herb, FNP.  She says that swelling has not gone down, but has not worsen.  She was given a steroid injection and was told to take benadryl and zantac and follow up with PCP in 3 days.   She was also advised to stop Invokana, which she says that she has stopped along with Lisinopril.   Appt scheduled for Friday, January 27th at 11:00 am with Debbrah Alar, NP.    She was advised if symptoms worsen or new symptoms develop to go to ER.  Pt stated understanding and agreed to comply.

## 2015-04-06 NOTE — Telephone Encounter (Signed)
PLEASE NOTE: All timestamps contained within this report are represented as [place] Standard Time. CONFIDENTIALTY NOTICE: This fax transmission is intended only for the addressee. It contains information that is legally privileged, confidential or otherwise protected from use or disclosure. If you are not the intended recipient, you are strictly prohibited from reviewing, disclosing, copying using or disseminating any of this information or taking any action in reliance on or regarding this information. If you have received this fax in error, please notify us immediately by telephone so that we can arrange for its return to [place]. Phone: [phone], Toll-Free: [phone], Fax: [phone] Page: 1 of 1 Call Id: [id number] [facility] - Client [place] Patient Name: Margaret Meyers DOB: 1970/11/21 Initial Comment Caller states she woke up this morning, her lips are swelled and nose are swelled. No trouble breathing. She's on a new injection- insulin. Nurse Assessment Nurse: Marcelline Deist, RN, Lynda Date/Time (Eastern Time): 04/06/2015 11:17:03 AM Confirm and document reason for call. If symptomatic, describe symptoms. You must click the next button to save text entered. ---Caller states she woke up this morning, her lips are swelled and nose are swelled. No trouble breathing. She's on a new injectioninsulin Invokana for about three weeks. Yesterday, had some more frequent palpitations & dizziness. Has the patient traveled out of the country within the last 30 days? ---Not Applicable Does the patient have any new or worsening symptoms? ---Yes Will a triage be completed? ---Yes Related visit to physician within the last 2 weeks? ---Yes Does the PT have any chronic conditions? (i.e. diabetes, asthma, etc.) ---Yes List chronic conditions. ---diabetes, on BP rx Did the patient indicate they were pregnant? ---No Is this a  behavioral health or substance abuse call? ---No Guidelines Guideline Title Affirmed Question Affirmed Notes Lip Swelling [1] Swelling is red AND [2] fever Final Disposition User See Physician within 4 Hours (or PCP triage) Marcelline Deist, RN, Lynda Comments Caller states she went off her Lisinopril about a week or so ago because her dtr saw something scary related to taking the rx & told her she should stop taking it. Referenced [facility] for Invokana & swelling of the face is a more common but major side effect listed. No availability on schedule for today, so patient will either be seen at an UC or the ER. Referrals GO TO FACILITY UNDECIDED Disagree/Comply: Comply

## 2015-04-06 NOTE — Telephone Encounter (Signed)
Called to follow up with patient.  Left a message for call back.   

## 2015-04-06 NOTE — Telephone Encounter (Signed)
Noted and agree. 

## 2015-04-09 ENCOUNTER — Encounter: Payer: Self-pay | Admitting: Family

## 2015-04-09 ENCOUNTER — Ambulatory Visit (INDEPENDENT_AMBULATORY_CARE_PROVIDER_SITE_OTHER): Payer: PRIVATE HEALTH INSURANCE | Admitting: Family

## 2015-04-09 VITALS — BP 144/110 | HR 69 | Temp 98.2°F | Resp 16 | Ht <= 58 in | Wt 212.2 lb

## 2015-04-09 DIAGNOSIS — I10 Essential (primary) hypertension: Secondary | ICD-10-CM

## 2015-04-09 DIAGNOSIS — T783XXA Angioneurotic edema, initial encounter: Secondary | ICD-10-CM | POA: Diagnosis not present

## 2015-04-09 MED ORDER — CARVEDILOL 12.5 MG PO TABS: 12.5000 mg | ORAL_TABLET | Freq: Two times a day (BID) | ORAL | Status: DC

## 2015-04-09 MED ORDER — EPINEPHRINE 0.3 MG/0.3ML IJ SOAJ: 0.3000 mg | Freq: Once | INTRAMUSCULAR | Status: DC

## 2015-04-09 NOTE — Patient Instructions (Addendum)
Remain off of lisinopril Keep epipen and liquid benadryl on hand. If recurrent tongue/lip/facial swelling, inject epipen and take dose of benadryl then call 911. Stop metoprolol, start coreg for blood pressure.

## 2015-04-09 NOTE — Progress Notes (Addendum)
Subjective:    Patient ID: Margaret Meyers, female    DOB: [birth year], 45 y.o.   MRN: [id number]  HPI   Ms. Margaret Meyers i a 45 yr old female who presents today for follow up from Urgent care. Lips/tonguenose swelling. Had been out of lisinopril x 1 week at that time.  Reports that initially it has been happening last 2 weeks.  Had tingling and though that lips were dry.  Had mild lip swelling one day last week.  Then resolved.  Woke up the following AM and had significant swelling.  Denied associated SOB.  She was given a steroid shot (decadron) and told to take benadryl and zantac.      Review of Systems See HPI  Past Medical History  Diagnosis Date  . Diabetes mellitus without complication ([place])   . Hypertension   . Asthma   . Heart murmur   . Obesity   . Migraine     Social History   Social History  . Marital Status: Single    Spouse Name: N/A  . Number of Children: 1  . Years of Education: 12+   Occupational History  . [employer]    Social History Main Topics  . Smoking status: Never Smoker   . Smokeless tobacco: Not on file  . Alcohol Use: Yes     Comment: occasional (holidays)  . Drug Use: No  . Sexual Activity: Yes    Birth Control/ Protection: Surgical   Other Topics Concern  . 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 Tubal ligation    . Leep  1988  . D&c  1992  . Abdominal hysterectomy  2009  . Abscess drainage  2009    after hysterectomy    Family History  Problem Relation Age of Onset  . Cancer Maternal Aunt     Breast  . Hypertension Maternal Grandmother   . Diabetes Neg Hx   . Heart disease Neg Hx   . Hyperlipidemia Neg Hx   . Migraines Neg  Hx   . Aneurysm Mother     brain x 4    Allergies  Allergen Reactions  . Penicillins Hives    Has patient had a PCN reaction causing immediate rash, facial/tongue/throat swelling, SOB or lightheadedness with hypotension: Yes Has patient had a PCN reaction causing severe rash involving mucus membranes or skin necrosis: No  Has patient had a PCN reaction that required hospitalization: No  Has patient had a PCN reaction occurring within the last 10 years: No     Current Outpatient Prescriptions on File Prior to Visit  Medication Sig Dispense Refill  . amLODipine (NORVASC) 10 MG tablet Take 1 tablet (10 mg total) by mouth daily. 30 tablet 2  . canagliflozin (INVOKANA) 100 MG TABS tablet Take 1 tablet (100 mg total) by mouth daily before breakfast. (Patient not taking: Reported on 04/09/2015) 30 tablet 2  . glyBURIDE (DIABETA) 5 MG tablet Take 1 tablet (5 mg total) by mouth daily with breakfast. 30 tablet 0  . hydrochlorothiazide (HYDRODIURIL) 25 MG tablet Take 0.5 tablets (12.5 mg total) by mouth daily. 30 tablet 1  .  Insulin Detemir (LEVEMIR FLEXTOUCH) 100 UNIT/ML Pen Inject 10 units SQ QHS 3 mL 0  . Insulin Pen Needle (NOVOFINE PLUS) 32G X 4 MM MISC Use as directed 100 each 3  . lisinopril (PRINIVIL,ZESTRIL) 20 MG tablet Take 1 tablet (20 mg total) by mouth daily. (Patient not taking: Reported on 04/09/2015) 30 tablet 5  . metoprolol (LOPRESSOR) 50 MG tablet Take 2 tablets (100 mg total) by mouth 2 (two) times daily. 60 tablet 0  . sitaGLIPtin (JANUVIA) 100 MG tablet Take 1 tablet (100 mg total) by mouth daily. 30 tablet 5  . [DISCONTINUED] beclomethasone (QVAR) 40 MCG/ACT inhaler Inhale 2 puffs into the lungs 2 (two) times daily. (Patient not taking: Reported on 11/23/2014) 1 Inhaler 0   No current facility-administered medications on file prior to visit.    Pulse 69  Temp(Src) 98.2 F (36.8 C) (Oral)  Resp 16  Ht 4\' 8"  (1.422 m)  Wt 212 lb 3.2 oz (96.253 kg)  BMI 47.60 kg/m2  SpO2  97%       Objective:   Physical Exam  Constitutional: She is oriented to person, place, and time. She appears well-developed and well-nourished.  HENT:  Head: Normocephalic and atraumatic.  Right Ear: Tympanic membrane and ear canal normal.  Left Ear: Tympanic membrane and ear canal normal.  Mouth/Throat: No oropharyngeal exudate, posterior oropharyngeal edema or posterior oropharyngeal erythema.  No tongue/lip/facial swelling noted.   Cardiovascular: Normal rate, regular rhythm and normal heart sounds.   No murmur heard. Pulmonary/Chest: Effort normal and breath sounds normal. No respiratory distress. She has no wheezes.  Musculoskeletal: She exhibits no edema.  Neurological: She is alert and oriented to person, place, and time.  Psychiatric: She has a normal mood and affect. Her behavior is normal. Judgment and thought content normal.          Assessment & Plan:  Angioedema- resolved. Will refer to allergist.  Advised pt as follows:  Remain off of lisinopril Keep epipen and liquid benadryl on hand. If recurrent tongue/lip/facial swelling, inject epipen and take dose of benadryl then call 911.

## 2015-04-09 NOTE — Assessment & Plan Note (Deleted)
New.  Resolved. Most likely cause was recent ACE use

## 2015-04-09 NOTE — Progress Notes (Signed)
Pre visit review using our clinic review tool, if applicable. No additional management support is needed unless otherwise documented below in the visit note. 

## 2015-04-10 NOTE — Assessment & Plan Note (Signed)
Uncontrolled. D/c metoprolol, start trial coreg for improved BP control.

## 2015-04-11 ENCOUNTER — Encounter: Payer: Self-pay | Admitting: Family

## 2015-04-12 ENCOUNTER — Encounter: Payer: Self-pay | Admitting: Family

## 2015-04-12 ENCOUNTER — Ambulatory Visit (INDEPENDENT_AMBULATORY_CARE_PROVIDER_SITE_OTHER): Payer: PRIVATE HEALTH INSURANCE | Admitting: Neurology

## 2015-04-12 ENCOUNTER — Encounter: Payer: Self-pay | Admitting: Neurology

## 2015-04-12 ENCOUNTER — Ambulatory Visit (INDEPENDENT_AMBULATORY_CARE_PROVIDER_SITE_OTHER): Payer: PRIVATE HEALTH INSURANCE | Admitting: Family

## 2015-04-12 ENCOUNTER — Telehealth: Payer: Self-pay | Admitting: *Deleted

## 2015-04-12 VITALS — BP 138/86 | HR 78 | Temp 98.5°F | Resp 16 | Ht <= 58 in | Wt 213.0 lb

## 2015-04-12 VITALS — BP 179/93 | HR 82 | Ht <= 58 in | Wt 213.4 lb

## 2015-04-12 DIAGNOSIS — E0849 Diabetes mellitus due to underlying condition with other diabetic neurological complication: Secondary | ICD-10-CM

## 2015-04-12 DIAGNOSIS — R51 Headache: Secondary | ICD-10-CM

## 2015-04-12 DIAGNOSIS — T7840XD Allergy, unspecified, subsequent encounter: Secondary | ICD-10-CM | POA: Diagnosis not present

## 2015-04-12 DIAGNOSIS — H9190 Unspecified hearing loss, unspecified ear: Secondary | ICD-10-CM

## 2015-04-12 DIAGNOSIS — H539 Unspecified visual disturbance: Secondary | ICD-10-CM

## 2015-04-12 DIAGNOSIS — I10 Essential (primary) hypertension: Secondary | ICD-10-CM | POA: Diagnosis not present

## 2015-04-12 DIAGNOSIS — G4489 Other headache syndrome: Secondary | ICD-10-CM

## 2015-04-12 DIAGNOSIS — T783XXA Angioneurotic edema, initial encounter: Secondary | ICD-10-CM | POA: Diagnosis not present

## 2015-04-12 DIAGNOSIS — H905 Unspecified sensorineural hearing loss: Secondary | ICD-10-CM | POA: Diagnosis not present

## 2015-04-12 DIAGNOSIS — E0865 Diabetes mellitus due to underlying condition with hyperglycemia: Secondary | ICD-10-CM | POA: Diagnosis not present

## 2015-04-12 DIAGNOSIS — R519 Headache, unspecified: Secondary | ICD-10-CM

## 2015-04-12 MED ORDER — METHYLPREDNISOLONE SODIUM SUCC 125 MG IJ SOLR
125.0000 mg | Freq: Once | INTRAMUSCULAR | Status: AC
  Administered 2015-04-12: 125 mg via INTRAMUSCULAR

## 2015-04-12 MED ORDER — EPINEPHRINE 0.3 MG/0.3ML IJ SOAJ: 0.3000 mg | Freq: Once | INTRAMUSCULAR | Status: AC

## 2015-04-12 MED ORDER — PREDNISONE 10 MG PO TABS: ORAL_TABLET | ORAL | Status: DC

## 2015-04-12 NOTE — Progress Notes (Addendum)
[id number]    Provider:  Dr Jaynee Eagles Referring Provider: Debbrah Alar, NP Primary Care Physician:  Nance Pear., NP  CC: headache  Addendum 2/20: patient's headaches worsening again despite increased BP control. LP normal OP at 20. Will start Topamax and order mri brain.  Discussed side effects. Serious side effects can inclue: Abdominal or stomach pain ,fever, chills, or sore throat , lessening of sensations or perception ,loss of appetite , mood or mental changes, including aggression, agitation, apathy, irritability, and mental depression , red, irritated, or bleeding gums , weight loss Rare Blood in the urine , decrease in sexual performance or desire , difficult or painful urination , frequent urination , hearing loss , loss of bladder control , lower back or side pain , nosebleeds , pale skin , red or irritated eyes , ringing or buzzing in the ears , skin rash or itching , swelling , trouble breathing, Common side effects of Topamax include:  tiredness, drowsiness, dizziness, nervousness, numbness or tingly feeling, coordination problems, diarrhea, weight loss, speech/language problems, changes in vision, sensory distortion, loss of appetite, bad taste in your mouth, confusion, slowed thinking, trouble concentrating or paying attention, memory problems,      Interval History 04/12/2015: Patient with uncontrolled HTN and DM2 for years, obesity here for follo wup on headaches. Headaches have significantly improved with improvement in her blood pressure which was uncontrolled. She is on coreg now and medications for her DM. She is also managing her HTN and diabetes now with her pcp. She still has multiple headache days a month. Her headaches are not as severe, now it is pressure, a constant ache, it can be a 10/10, she has vision changes and blurriness. I received the notes from [facility] neurology which I reviewed.  They had started her on Topamax, she does not  remember what happened with taking it, she has been on so many medications. They also recommended LP if headaches did not improve with blood pressure management. She does get hearing change and vision changes with the headaches. Discussed notes from [place]. QTC 447. Headaches can last 2-3 days and she will wake up with it and go to sleep with it. She may the headache up to 6-7 days a month 10/10. No significant snoring.   Other than an 52mm left frontal meningioma, CT of the head in 2010 normal and MRi of the brain was normal in 2012. (reviewed CT from 10/2014 and meningioma appears unchanged)  She has to be moving all the time. She feels restless like she has to move. She denies the urge to move, it is just comforting to her. She rocks sometimes. She has a feeling that she has to move her legs at night however this helps her sleep.  HPI 01/04/2015: Margaret Meyers is a 45 y.o. female here as a referral from Dr. No ref. provider found for headaches.PMHx obesity, uncontrolled diabetes and HTN. Her blood pressures have been very high. The lowest it has been in the last year is 180/98. She is taking her blood pressure medications. She has not been to a primary care for about a year due to insurance problems. Today in the office 205/110 manual. Her headaches started in 2011 since her mother passed. Then her dad died. Her mother died with brain aneurysm and father with a brain hemorrhage. She was seen by neurology is [place]. She thinks they saw a meningioma on imaging. Around that time she found out her blood pressure and glucse were both  high. She went to the ED last week because her glucose was 468 and she took metformin and was in the 200s in the ED. She does not take metformin every day because it makes her sick. She takes glyburide. She is on lisinopril, hctz. She is out of the amlodipine and metoprolol. Her headaches have been worse since the summer. Pain and pressure behind the eyes, pain in the back of  the head and neck, and oressure on top of the head. Headaches are daily. She goes to sleep with the headache and wakes up with the headache. She ran out of her metoprolol and has not been taking it. She has light sensitivity and has to squint most of the days, she wears her shades all day, she has a lot of nausea.   Reviewed notes, labs and imaging from outside physicians, which showed:   CT of the head 10/30/2014 showed No acute intracranial abnormalities including mass lesion or mass effect, hydrocephalus, extra-axial fluid collection, midline shift, hemorrhage, or acute infarction, large ischemic events (personally reviewed images)  BMP 10/2014 glucose 375, cbc unremarkable Review of Systems: Patient complains of symptoms per HPI as well as the following symptoms: fatigue, blurred vision, dizziness, depression, daytime sleepiness. Pertinent negatives per HPI. All others negative.   Social History   Social History  . Marital Status: Single    Spouse Name: N/A  . Number of Children: 1  . Years of Education: 12+   Occupational History  . [employer]    Social History Main Topics  . Smoking status: Never Smoker   . Smokeless tobacco: Not on file  . Alcohol Use: Yes     Comment: occasional (holidays)  . Drug Use: No  . Sexual Activity: Yes    Birth Control/ Protection: Surgical   Other Topics Concern  . 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 Cancer Maternal Aunt     Breast  . Hypertension Maternal Grandmother   . Diabetes Neg Hx   . Heart disease Neg Hx   . Hyperlipidemia Neg Hx   . Migraines Neg Hx   . Aneurysm Mother     brain x 4    Past Medical History  Diagnosis Date    . Diabetes mellitus without complication ([place])   . Hypertension   . Asthma   . Heart murmur   . Obesity   . Migraine     Past Surgical History  Procedure Laterality Date  . Tubal ligation    . Leep  1988  . D&c  1992  . Abdominal hysterectomy  2009  . Abscess drainage  2009    after hysterectomy    Current Outpatient Prescriptions  Medication Sig Dispense Refill  . amLODipine (NORVASC) 10 MG tablet Take 1 tablet (10 mg total) by mouth daily. 30 tablet 2  . canagliflozin (INVOKANA) 100 MG TABS tablet Take 1 tablet (100 mg total) by mouth daily before breakfast. 30 tablet 2  . carvedilol (COREG) 12.5 MG tablet Take 1 tablet (12.5 mg total) by mouth 2 (two) times daily with a meal. 60 tablet 3  . EPINEPHrine 0.3 mg/0.3 mL IJ  SOAJ injection Inject 0.3 mLs (0.3 mg total) into the muscle once. 2 Device 0  . glyBURIDE (DIABETA) 5 MG tablet Take 1 tablet (5 mg total) by mouth daily with breakfast. 30 tablet 0  . hydrochlorothiazide (HYDRODIURIL) 25 MG tablet Take 0.5 tablets (12.5 mg total) by mouth daily. 30 tablet 1  . Insulin Detemir (LEVEMIR FLEXTOUCH) 100 UNIT/ML Pen Inject 10 units SQ QHS 3 mL 0  . Insulin Pen Needle (NOVOFINE PLUS) 32G X 4 MM MISC Use as directed 100 each 3  . sitaGLIPtin (JANUVIA) 100 MG tablet Take 1 tablet (100 mg total) by mouth daily. 30 tablet 5  . [DISCONTINUED] beclomethasone (QVAR) 40 MCG/ACT inhaler Inhale 2 puffs into the lungs 2 (two) times daily. (Patient not taking: Reported on 11/23/2014) 1 Inhaler 0   No current facility-administered medications for this visit.    Allergies as of 04/12/2015 - Review Complete 04/12/2015  Allergen Reaction Noted  . Lisinopril  04/09/2015  . Penicillins Hives 07/04/2012    Vitals: BP 179/93 mmHg  Pulse 82  Ht 4\' 8"  (1.422 m)  Wt 213 lb 6.4 oz (96.798 kg)  BMI 47.87 kg/m2 Last Weight:  Wt Readings from Last 1 Encounters:  04/12/15 213 lb 6.4 oz (96.798 kg)   Last Height:   Ht Readings from Last 1  Encounters:  04/12/15 4\' 8"  (1.422 m)    Speech:  Speech is normal; fluent and spontaneous with normal comprehension.  Cognition:  The patient is oriented to person, place, and time;   recent and remote memory intact;   language fluent;   normal attention, concentration,   fund of knowledge Cranial Nerves:  The pupils are equal, round, and reactive to light. The fundi are flat. Visual fields are full to finger confrontation. Extraocular movements are intact. Trigeminal sensation is intact and the muscles of mastication are normal. The face is symmetric. The palate elevates in the midline. Hearing intact. Voice is normal. Shoulder shrug is normal. The tongue has normal motion without fasciculations.   Coordination:  Normal finger to nose and heel to shin. Normal rapid alternating movements.   Gait:  Heel-toe and tandem gait are normal.   Motor Observation:  No asymmetry, no atrophy, and no involuntary movements noted. Tone:  Normal muscle tone.   Posture:  Posture is normal. normal erect   Strength:  Strength is V/V in the upper and lower limbs.    Sensation: intact to LT   Reflex Exam:  DTR's: Absent AJs otherwise deep tendon reflexes in the upper and lower extremities are normal bilaterally.  Toes:  The toes are downgoing bilaterally.  Clonus:  Clonus is absent.      Assessment/Plan: 45 year old with uncontrolled HTN and uncontrolled DM, obesity here for headaches. BP extremely high last time in office at over [id number] systolic. She is better with BP and diabetes control but still having 10/10 headaches, blurry vision, hearing changes 7 days a month. Will order a lumbar puncture for opening pressure and treat with diamox if high otherwise can try Topamax.  Discussed compliance and importance of f/u with pcp. Will refer to ophthalmology to evaluate.    Sarina Ill, MD  [facility] [address] [place], [place] [address]  Phone [phone] Fax [phone]  A total of 30 minutes was spent face-to-face with this patient. Over half this time was spent on counseling patient on the headache diagnosis and different diagnostic and therapeutic options available.

## 2015-04-12 NOTE — Telephone Encounter (Signed)
Spoke to [place] at [facility] imaging and advised Dr Jaynee Eagles placed orders for LP. She will have Chrys Racer call to schedule pt from their office.

## 2015-04-12 NOTE — Progress Notes (Signed)
Pre visit review using our clinic review tool, if applicable. No additional management support is needed unless otherwise documented below in the visit note. 

## 2015-04-12 NOTE — Telephone Encounter (Signed)
Notified pt and scheduled appt at 5:30pm today with PCP. Advised pt is she develops worsening swelling, tongue swelling, sob she should be evaluated in the ER as she was not able to afford Epi pen yet. States lip swelling is a little better today but seem to get worse as the day progresses.

## 2015-04-12 NOTE — Patient Instructions (Signed)
Remember to drink plenty of fluid, eat healthy meals and do not skip any meals. Try to eat protein with a every meal and eat a healthy snack such as fruit or nuts in between meals. Try to keep a regular sleep-wake schedule and try to exercise daily, particularly in the form of walking, 20-30 minutes a day, if you can.   As far as diagnostic testing: Lumbar puncture and Referral to ophthalmology  I would like to see you back in 4 months, sooner if we need to. Please call us with any interim questions, concerns, problems, updates or refill requests.   Please also call us for any test results so we can go over those with you on the phone.  My clinical assistant and will answer any of your questions and relay your messages to me and also relay most of my messages to you.   Our phone number is [phone]. We also have an after hours call service for urgent matters and there is a physician on-call for urgent questions. For any emergencies you know to call 911 or go to the nearest emergency room

## 2015-04-12 NOTE — Progress Notes (Signed)
Subjective:    Patient ID: Margaret Meyers, female    DOB: 06-Nov-1970, 45 y.o.   MRN: [id number]  HPI  Margaret Meyers is a 45 yr old female who presents today for follow up.  She was treated for angioedema prior to her last visit. She reports that her lips are swollen.  Reports diarrhea all day today. She reports that it felt like her tongue was swollen a little earlier today but now improved.  She denies SOB.  Does have a cough. Denies wheeze.  Denies hives/rash.    Review of Systems     Past Medical History  Diagnosis Date  . Diabetes mellitus without complication ([place])   . Hypertension   . Asthma   . Heart murmur   . Obesity   . Migraine     Social History   Social History  . Marital Status: Single    Spouse Name: N/A  . Number of Children: 1  . Years of Education: 12+   Occupational History  . [employer]    Social History Main Topics  . Smoking status: Never Smoker   . Smokeless tobacco: Not on file  . Alcohol Use: Yes     Comment: occasional (holidays)  . Drug Use: No  . Sexual Activity: Yes    Birth Control/ Protection: Surgical   Other Topics Concern  . 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 Tubal ligation    . Leep  1988  . D&c  1992  . Abdominal hysterectomy  2009  . Abscess drainage  2009    after hysterectomy    Family History  Problem Relation Age of Onset  . Cancer Maternal Aunt     Breast  . Hypertension Maternal Grandmother   . Diabetes Neg Hx   . Heart disease Neg Hx   . Hyperlipidemia Neg Hx   . Migraines Neg Hx   . Aneurysm Mother     brain x 4    Allergies  Allergen Reactions  . Lisinopril     ? angioedema  . Penicillins  Hives    Has patient had a PCN reaction causing immediate rash, facial/tongue/throat swelling, SOB or lightheadedness with hypotension: Yes Has patient had a PCN reaction causing severe rash involving mucus membranes or skin necrosis: No  Has patient had a PCN reaction that required hospitalization: No  Has patient had a PCN reaction occurring within the last 10 years: No     Current Outpatient Prescriptions on File Prior to Visit  Medication Sig Dispense Refill  . amLODipine (NORVASC) 10 MG tablet Take 1 tablet (10 mg total) by mouth daily. 30 tablet 2  . canagliflozin (INVOKANA) 100 MG TABS tablet Take 1 tablet (100 mg total) by mouth daily before breakfast. 30 tablet 2  . carvedilol (COREG) 12.5 MG tablet Take 1 tablet (12.5 mg total) by mouth 2 (two) times daily with a meal. 60 tablet 3  . EPINEPHrine 0.3 mg/0.3 mL IJ SOAJ injection Inject 0.3 mLs (0.3 mg total) into the muscle once. 2 Device 0  . glyBURIDE (DIABETA) 5 MG tablet Take 1 tablet (5 mg total) by mouth  daily with breakfast. 30 tablet 0  . hydrochlorothiazide (HYDRODIURIL) 25 MG tablet Take 0.5 tablets (12.5 mg total) by mouth daily. 30 tablet 1  . Insulin Detemir (LEVEMIR FLEXTOUCH) 100 UNIT/ML Pen Inject 10 units SQ QHS 3 mL 0  . Insulin Pen Needle (NOVOFINE PLUS) 32G X 4 MM MISC Use as directed 100 each 3  . sitaGLIPtin (JANUVIA) 100 MG tablet Take 1 tablet (100 mg total) by mouth daily. 30 tablet 5  . [DISCONTINUED] beclomethasone (QVAR) 40 MCG/ACT inhaler Inhale 2 puffs into the lungs 2 (two) times daily. (Patient not taking: Reported on 11/23/2014) 1 Inhaler 0   No current facility-administered medications on file prior to visit.    BP 138/86 mmHg  Pulse 78  Temp(Src) 98.5 F (36.9 C) (Oral)  Resp 16  Ht 4\' 8"  (1.422 m)  Wt 213 lb (96.616 kg)  BMI 47.78 kg/m2  SpO2 99%    Objective:   Physical Exam  Constitutional: She appears well-developed and well-nourished.  HENT:  No tongue swelling Mild swelling of  lower lip Mild erythema of upper lip  Cardiovascular: Normal rate, regular rhythm and normal heart sounds.   No murmur heard. Pulmonary/Chest: Effort normal and breath sounds normal. No respiratory distress. She has no wheezes.  Skin:  No rash or hives.  Psychiatric: She has a normal mood and affect. Her behavior is normal. Judgment and thought content normal.          Assessment & Plan:

## 2015-04-12 NOTE — Patient Instructions (Addendum)
Start prednisone. Continue benadryl every 6 hours until lip swelling/itching is resolved. Continue zantac twice daily for next 4-5 days.  Keep your upcoming appointment with Allergist on Monday. Bring Epi pen rx to [facility] to see if you can afford the generic version. Go to ER if symptoms worsen, if shortness of breath/tongue/throat swelling.

## 2015-04-12 NOTE — Telephone Encounter (Signed)
Pt should be seen back in our office today please.

## 2015-04-13 NOTE — Assessment & Plan Note (Signed)
She is given IM solumedrol in the office, to be followed by short course of oral steroids.  Restart zantac, and continue benadryl.  She could not afford epipen, i gave her generic rx to bring to [facility] to see if she can afford there.  Pt advised to keep upcoming apt with allergist. Go to ER if symptoms worsen, if shortness of breath/tongue/throat swelling.

## 2015-04-19 ENCOUNTER — Encounter: Payer: Self-pay | Admitting: Family

## 2015-04-19 MED FILL — CARVEDILOL 12.5 MG TABLET: 12.5 | 30 days supply | Qty: 60 | Fill #0

## 2015-04-19 MED FILL — HYDROCHLOROTHIAZIDE 25 MG T: 25 | 30 days supply | Qty: 15 | Fill #0

## 2015-04-23 ENCOUNTER — Ambulatory Visit
Admission: RE | Admit: 2015-04-23 | Discharge: 2015-04-23 | Disposition: A | Discharge status: Home / Self Care | Payer: PRIVATE HEALTH INSURANCE | Source: Ambulatory Visit | Attending: Neurology | Admitting: Neurology

## 2015-04-23 ENCOUNTER — Other Ambulatory Visit: Payer: Self-pay | Admitting: Neurology

## 2015-04-23 DIAGNOSIS — I10 Essential (primary) hypertension: Secondary | ICD-10-CM

## 2015-04-23 DIAGNOSIS — H539 Unspecified visual disturbance: Secondary | ICD-10-CM

## 2015-04-23 DIAGNOSIS — G4489 Other headache syndrome: Secondary | ICD-10-CM

## 2015-04-23 DIAGNOSIS — R519 Headache, unspecified: Secondary | ICD-10-CM

## 2015-04-23 DIAGNOSIS — E0865 Diabetes mellitus due to underlying condition with hyperglycemia: Secondary | ICD-10-CM

## 2015-04-23 DIAGNOSIS — R51 Headache: Secondary | ICD-10-CM

## 2015-04-23 DIAGNOSIS — E0849 Diabetes mellitus due to underlying condition with other diabetic neurological complication: Secondary | ICD-10-CM

## 2015-04-23 DIAGNOSIS — H9190 Unspecified hearing loss, unspecified ear: Secondary | ICD-10-CM

## 2015-04-23 LAB — CSF CELL COUNT WITH DIFFERENTIAL
RBC COUNT CSF: 0 uL
Tube #: 4
WBC CSF: 3 uL (ref 0–5)

## 2015-04-23 LAB — PROTEIN, CSF: Total Protein, CSF: 35 mg/dL (ref 15–45)

## 2015-04-23 LAB — GLUCOSE, CSF: GLUCOSE CSF: 179 mg/dL — AB (ref 43–76)

## 2015-04-23 NOTE — Discharge Instructions (Signed)

## 2015-04-26 ENCOUNTER — Telehealth: Payer: Self-pay | Admitting: Neurology

## 2015-04-26 DIAGNOSIS — G971 Other reaction to spinal and lumbar puncture: Secondary | ICD-10-CM

## 2015-04-26 LAB — CSF CULTURE
GRAM STAIN: NONE SEEN
GRAM STAIN: NONE SEEN
ORGANISM ID, BACTERIA: NO GROWTH

## 2015-04-26 NOTE — Telephone Encounter (Addendum)
Chrys Racer with [facility] called back and can schedule pt for 2/14 at 8 am , pt will need to come at 7:45 . She requested that we call the pt to let her know. please put in an order for blood patch as well . And if that does not work to call them back.  I called the pt myself and she is agreeable to the appt time. She did not have any other questions. Thank you

## 2015-04-26 NOTE — Telephone Encounter (Signed)
Called pt back. She states she gets a terrible headache when she stands up. She only gets relief when she lays down. Advised per Dr Jaynee Eagles that we are going to send her for blood patch. Explained procedure to her. Advised I am going to call [facility] imaging to see when we can fit her in for procedure and I will call her back to advise. She verbalized understanding.   LVM for Margaret Meyers at [facility] imaging to call back to see when we can schedule pt for blood patch. Gave [facility] phone number.

## 2015-04-26 NOTE — Telephone Encounter (Signed)
Pt said she had LP on Friday 04/22/15. She slept all day Saturday. Sunday every time she sat up she would get an excruciating HA so she decided to stay in bed. Today is the same problem although she realized on Sunday that if she lies on her side or back the HA is minimal. 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 Please call at [phone]

## 2015-04-26 NOTE — Telephone Encounter (Signed)
Dr Ahern- FYI 

## 2015-04-26 NOTE — Telephone Encounter (Signed)
Thank you emma

## 2015-04-27 ENCOUNTER — Encounter: Payer: Self-pay | Admitting: Neurology

## 2015-04-27 ENCOUNTER — Encounter: Payer: Self-pay | Admitting: Family

## 2015-04-27 ENCOUNTER — Ambulatory Visit
Admission: RE | Admit: 2015-04-27 | Discharge: 2015-04-27 | Disposition: A | Discharge status: Home / Self Care | Payer: PRIVATE HEALTH INSURANCE | Source: Ambulatory Visit | Attending: Neurology | Admitting: Neurology

## 2015-04-27 DIAGNOSIS — G971 Other reaction to spinal and lumbar puncture: Secondary | ICD-10-CM

## 2015-04-27 MED ORDER — GLYBURIDE 5 MG PO TABS: 5.0000 mg | ORAL_TABLET | Freq: Every day | ORAL | Status: DC

## 2015-04-27 MED ORDER — IOHEXOL 180 MG/ML  SOLN
1.0000 mL | Freq: Once | INTRAMUSCULAR | Status: AC | PRN
  Administered 2015-04-27: 1 mL via INTRAVENOUS

## 2015-04-27 MED FILL — glyBURIDE 5 MG TABS: 5 | 30 days supply | Qty: 30 | Fill #0

## 2015-04-27 NOTE — Telephone Encounter (Signed)
Margaret Meyers--  Pt should still have refills on januvia.  Please advise re: glyburide refill. We have not filled rx before. Looks like GYN was filling rx.

## 2015-04-27 NOTE — Discharge Instructions (Signed)

## 2015-04-27 NOTE — Progress Notes (Signed)
20cc blood drawn from right AC space for epidural blood patch; site unremarkable.  jkl

## 2015-04-27 NOTE — Telephone Encounter (Signed)
Per verbal from PCP, ok to refill glyburide. Refill sent. Verified with Edwin Dada at [facility] that [facility] has refills on file and she will refill Rx. Sent mychart message to pt.

## 2015-04-27 NOTE — Progress Notes (Signed)
Blood drawn from right [facility] for blood patch.  Site is unremarkable 20 cc's obtained.

## 2015-04-29 ENCOUNTER — Telehealth: Payer: Self-pay | Admitting: *Deleted

## 2015-04-29 DIAGNOSIS — Z0289 Encounter for other administrative examinations: Secondary | ICD-10-CM

## 2015-04-29 NOTE — Telephone Encounter (Signed)
Patient FMLA form on [employer].

## 2015-04-30 ENCOUNTER — Telehealth: Payer: Self-pay | Admitting: *Deleted

## 2015-04-30 NOTE — Telephone Encounter (Signed)
Letter is fine, I will call her about the results today or tomorrow but they were really not significant, essentially normal. You can tell her the results were essentially normal and I will call her to discuss. thanks

## 2015-04-30 NOTE — Telephone Encounter (Signed)
Called patient. Opening pressure 20 which is normal. Left message. Will try in the morning again. thanks

## 2015-04-30 NOTE — Telephone Encounter (Signed)
I called and LM with family member calling about filling out form.  Pt will call back.

## 2015-04-30 NOTE — Telephone Encounter (Signed)
LVM for pt to call back. Gave [facility] phone number and hours.  

## 2015-04-30 NOTE — Telephone Encounter (Addendum)
I spoke with pt.  She needed the FMLA to cover time she was off 04-22-15 for [employer] thru 04-28-15 due to positional headache and then BPatch.  Back to work 04-29-15.  Also intermittent for headaches.  Pt asked if we had results back from LP.  She had been told she had elevated pressure.   I told her I would forward to [facility] for follow up on.

## 2015-05-03 ENCOUNTER — Other Ambulatory Visit: Payer: Self-pay | Admitting: Neurology

## 2015-05-03 DIAGNOSIS — H539 Unspecified visual disturbance: Secondary | ICD-10-CM

## 2015-05-03 DIAGNOSIS — R519 Headache, unspecified: Secondary | ICD-10-CM

## 2015-05-03 DIAGNOSIS — R51 Headache: Secondary | ICD-10-CM

## 2015-05-03 DIAGNOSIS — I10 Essential (primary) hypertension: Secondary | ICD-10-CM

## 2015-05-03 MED ORDER — TOPIRAMATE 25 MG PO TABS: 100.0000 mg | ORAL_TABLET | Freq: Every day | ORAL | Status: AC

## 2015-05-03 MED FILL — TOPIRAMATE 25 MG TABLET: 25 | 30 days supply | Qty: 120 | Fill #0 | Status: TO

## 2015-05-03 NOTE — Telephone Encounter (Signed)
Opening pressure normal at 20. Glucose and and protein elevated likely due to uncontrolled diabetes. Patient understands. This rules out IIH thanks.

## 2015-05-03 NOTE — Telephone Encounter (Signed)
Rn call patient that the pressure was 20 and was normal. Pt saw it in mychart and receive a vm for Dr.Ahern.Pt verbalized understanding.

## 2015-05-10 ENCOUNTER — Telehealth: Payer: Self-pay | Admitting: *Deleted

## 2015-05-10 NOTE — Telephone Encounter (Signed)
Patient FMLA  form faxed to [employer] on 04/2715.

## 2015-05-12 ENCOUNTER — Inpatient Hospital Stay: Admission: RE | Admit: 2015-05-12 | Payer: PRIVATE HEALTH INSURANCE | Source: Ambulatory Visit

## 2015-05-28 MED FILL — CARVEDILOL 12.5 MG TABLET: 12.5 | 30 days supply | Qty: 60 | Fill #1 | Status: TO

## 2015-05-28 MED FILL — HYDROCHLOROTHIAZIDE 25 MG T: 25 | 30 days supply | Qty: 15 | Fill #1 | Status: TO

## 2015-05-28 MED FILL — glyBURIDE 5 MG TABS: 5 | 30 days supply | Qty: 30 | Fill #1 | Status: TO

## 2015-05-31 ENCOUNTER — Encounter: Payer: Self-pay | Admitting: Neurology

## 2015-05-31 ENCOUNTER — Encounter: Payer: Self-pay | Admitting: Family

## 2015-05-31 MED ORDER — BASAGLAR KWIKPEN 100 UNIT/ML ~~LOC~~ SOPN: 10.0000 [IU] | PEN_INJECTOR | Freq: Every day | SUBCUTANEOUS | Status: DC

## 2015-05-31 NOTE — Telephone Encounter (Signed)
See mychart.  

## 2015-06-10 ENCOUNTER — Encounter: Payer: Self-pay | Admitting: *Deleted

## 2015-06-10 NOTE — Progress Notes (Signed)
Faxed completed note from [facility] through pt work. Pt only approved time off between 04/22/15-04/28/15 per Dr Jaynee Eagles. Fax: [phone]. Received confirmation. Sent copy to MR.

## 2015-07-19 ENCOUNTER — Emergency Department (HOSPITAL_BASED_OUTPATIENT_CLINIC_OR_DEPARTMENT_OTHER): Payer: No Typology Code available for payment source

## 2015-07-19 ENCOUNTER — Emergency Department (HOSPITAL_BASED_OUTPATIENT_CLINIC_OR_DEPARTMENT_OTHER)
Admission: EM | Admit: 2015-07-19 | Discharge: 2015-07-19 | Disposition: A | Discharge status: Home / Self Care | Payer: No Typology Code available for payment source | Attending: Emergency Medicine | Admitting: Emergency Medicine

## 2015-07-19 ENCOUNTER — Encounter (HOSPITAL_BASED_OUTPATIENT_CLINIC_OR_DEPARTMENT_OTHER): Payer: Self-pay | Admitting: *Deleted

## 2015-07-19 DIAGNOSIS — Z79899 Other long term (current) drug therapy: Secondary | ICD-10-CM | POA: Insufficient documentation

## 2015-07-19 DIAGNOSIS — E119 Type 2 diabetes mellitus without complications: Secondary | ICD-10-CM | POA: Insufficient documentation

## 2015-07-19 DIAGNOSIS — J45909 Unspecified asthma, uncomplicated: Secondary | ICD-10-CM | POA: Insufficient documentation

## 2015-07-19 DIAGNOSIS — I10 Essential (primary) hypertension: Secondary | ICD-10-CM | POA: Diagnosis not present

## 2015-07-19 DIAGNOSIS — R079 Chest pain, unspecified: Secondary | ICD-10-CM | POA: Diagnosis not present

## 2015-07-19 DIAGNOSIS — E669 Obesity, unspecified: Secondary | ICD-10-CM | POA: Diagnosis not present

## 2015-07-19 DIAGNOSIS — Z794 Long term (current) use of insulin: Secondary | ICD-10-CM | POA: Insufficient documentation

## 2015-07-19 LAB — CBC
HEMATOCRIT: 38.8 % (ref 36.0–46.0)
HEMOGLOBIN: 13 g/dL (ref 12.0–15.0)
MCH: 27 pg (ref 26.0–34.0)
MCHC: 33.5 g/dL (ref 30.0–36.0)
MCV: 80.5 fL (ref 78.0–100.0)
Platelets: 287 10*3/uL (ref 150–400)
RBC: 4.82 MIL/uL (ref 3.87–5.11)
RDW: 14.2 % (ref 11.5–15.5)
WBC: 8.4 10*3/uL (ref 4.0–10.5)

## 2015-07-19 LAB — BASIC METABOLIC PANEL
ANION GAP: 5 (ref 5–15)
BUN: 18 mg/dL (ref 6–20)
CALCIUM: 8.2 mg/dL — AB (ref 8.9–10.3)
CO2: 26 mmol/L (ref 22–32)
Chloride: 102 mmol/L (ref 101–111)
Creatinine, Ser: 1.02 mg/dL — ABNORMAL HIGH (ref 0.44–1.00)
GFR calc Af Amer: >60 mL/min (ref >60)
Glucose, Bld: 378 mg/dL — ABNORMAL HIGH (ref 65–99)
POTASSIUM: 3.9 mmol/L (ref 3.5–5.1)
SODIUM: 133 mmol/L — AB (ref 135–145)

## 2015-07-19 LAB — D-DIMER, QUANTITATIVE (NOT AT ARMC): D DIMER QUANT: 0.38 ug{FEU}/mL (ref 0.00–0.50)

## 2015-07-19 LAB — BRAIN NATRIURETIC PEPTIDE: B NATRIURETIC PEPTIDE 5: 46.9 pg/mL (ref 0.0–100.0)

## 2015-07-19 LAB — TROPONIN I

## 2015-07-19 NOTE — ED Notes (Signed)
Epigastric, left shoulder and head pain x 2 days. She took tums with no relief.

## 2015-07-19 NOTE — ED Provider Notes (Signed)
CSN: [id number]     Arrival date & time 07/19/15  1444 History  By signing my name below, I, Doran Stabler, attest that this documentation has been prepared under the direction and in the presence of Veryl Speak, MD. Electronically Signed: Doran Stabler, ED Scribe. 07/19/2015. 3:20 PM.   Chief Complaint  Patient presents with  . Chest Pain   The history is provided by the patient. No language interpreter was used.   HPI Comments: Margaret Meyers is a 45 y.o. female who presents to the Emergency Department with a PMHx of HTN and DM complaining of intermittent left-sided chest pain that began 2 days ago. Pt also reports resolved nausea, SOB, HA, and 4 days of left ankle swelling. She states her pain radiates to the right side of her chest and left shoulder. Pt describes her pain as a "stabbing and twisting" pain. Pt states she is SOB when talking and climbing stairs. Pt denies aggravation with foods. Pt tried using Tums and baking soda mixed with water with no relief. Pt denies any fevers, chills, vomiting, diarrhea, or any other symptoms at this time. Pt does not smoke. Pt denies hx of heart problems or blood clots.   Past Medical History  Diagnosis Date  . Diabetes mellitus without complication ([place])   . Hypertension   . Asthma   . Heart murmur   . Obesity   . Migraine    Past Surgical History  Procedure Laterality Date  . Tubal ligation    . Leep  1988  . D&c  1992  . Abdominal hysterectomy  2009  . Abscess drainage  2009    after hysterectomy   Family History  Problem Relation Age of Onset  . Cancer Maternal Aunt     Breast  . Hypertension Maternal Grandmother   . Diabetes Neg Hx   . Heart disease Neg Hx   . Hyperlipidemia Neg Hx   . Migraines Neg Hx   . Aneurysm Mother     brain x 4   Social History  Substance Use Topics  . Smoking status: Never Smoker   . Smokeless tobacco: None  . Alcohol Use: Yes     Comment: occasional (holidays)   OB History    Gravida Para  Term Preterm AB TAB SAB Ectopic Multiple Living   4 2 2  2  2   2      Review of Systems A complete 10 system review of systems was obtained and all systems are negative except as noted in the HPI and PMH.   Allergies  Lisinopril and Penicillins  Home Medications   Prior to Admission medications   Medication Sig Start Date End Date Taking? Authorizing Provider  amLODipine (NORVASC) 10 MG tablet Take 1 tablet (10 mg total) by mouth daily. 03/23/15   Debbrah Alar, NP  canagliflozin (INVOKANA) 100 MG TABS tablet Take 1 tablet (100 mg total) by mouth daily before breakfast. 02/15/15   Debbrah Alar, NP  carvedilol (COREG) 12.5 MG tablet Take 1 tablet (12.5 mg total) by mouth 2 (two) times daily with a meal. 04/09/15   Debbrah Alar, NP  EPINEPHrine 0.3 mg/0.3 mL IJ SOAJ injection Inject 0.3 mLs (0.3 mg total) into the muscle once. 04/12/15   Debbrah Alar, NP  glyBURIDE (DIABETA) 5 MG tablet Take 1 tablet (5 mg total) by mouth daily with breakfast. 04/27/15   Debbrah Alar, NP  hydrochlorothiazide (HYDRODIURIL) 25 MG tablet Take 0.5 tablets (12.5 mg total) by mouth daily. 03/23/15  Debbrah Alar, NP  Insulin Glargine (BASAGLAR KWIKPEN) 100 UNIT/ML SOPN Inject 0.1 mLs (10 Units total) into the skin at bedtime. 05/31/15   Debbrah Alar, NP  Insulin Pen Needle (NOVOFINE PLUS) 32G X 4 MM MISC Use as directed 03/16/15   Debbrah Alar, NP  predniSONE (DELTASONE) 10 MG tablet 4 tabs by mouth once daily for 5 days 04/12/15   Debbrah Alar, NP  sitaGLIPtin (JANUVIA) 100 MG tablet Take 1 tablet (100 mg total) by mouth daily. 01/26/15   Debbrah Alar, NP  topiramate (TOPAMAX) 25 MG tablet Take 4 tablets (100 mg total) by mouth at bedtime. Start with 1 pill at bed and increase by 1 pill each week until full dose. 05/03/15   Melvenia Beam, MD   BP 149/87 mmHg  Pulse 90  Temp(Src) 98.4 F (36.9 C) (Oral)  Resp 16  Ht 4\' 8"  (1.422 m)  Wt 213 lb (96.616 kg)  BMI  47.78 kg/m2  SpO2 95%   Physical Exam  Constitutional: She is oriented to person, place, and time. She appears well-developed and well-nourished. No distress.  HENT:  Head: Normocephalic and atraumatic.  Mouth/Throat: Oropharynx is clear and moist. No oropharyngeal exudate.  Eyes: Conjunctivae and EOM are normal. Pupils are equal, round, and reactive to light.  Neck: Normal range of motion. Neck supple.  No meningismus.  Cardiovascular: Normal rate, regular rhythm, normal heart sounds and intact distal pulses.   No murmur heard. Pulmonary/Chest: Effort normal and breath sounds normal. No respiratory distress. She has no wheezes. She has no rales.  Abdominal: Soft. Bowel sounds are normal. She exhibits no distension. There is no tenderness. There is no rebound and no guarding.  Musculoskeletal: Normal range of motion. She exhibits no edema or tenderness.  No calf tenderness. Homan's sign is absent bilaterally.   Neurological: She is alert and oriented to person, place, and time. No cranial nerve deficit. She exhibits normal muscle tone. Coordination normal.  No ataxia on finger to nose bilaterally. No pronator drift. 5/5 strength throughout. CN 2-12 intact.Equal grip strength. Sensation intact.   Skin: Skin is warm and dry.  Psychiatric: She has a normal mood and affect. Her behavior is normal.  Nursing note and vitals reviewed.   ED Course  Procedures  DIAGNOSTIC STUDIES: Oxygen Saturation is 95% on room air, normal by my interpretation.    COORDINATION OF CARE: 3:13 PM Will order CXR, EKG, and blood work.  Discussed treatment plan with pt at bedside and pt agreed to plan.  Labs Review Labs Reviewed  BASIC METABOLIC PANEL  CBC  TROPONIN I   Imaging Review No results found. I have personally reviewed and evaluated these images and lab results as part of my medical decision-making.   EKG Interpretation   Date/Time:  Monday Jul 19 2015 14:59:05 EDT Ventricular Rate:  87 PR  Interval:  138 QRS Duration: 89 QT Interval:  387 QTC Calculation: 466 R Axis:   -57 Text Interpretation:  Sinus rhythm Inferior infarct, old Consider anterior  infarct Confirmed by Josephina Melcher  MD, Eldrick Penick ([id number]) on 07/19/2015 3:10:48 PM      MDM   Final diagnoses:  None    Patient presents here with complaints of pain in her left anterior chest. Her symptoms are atypical for cardiac pain and nothing in her workup indicates a cardiac etiology. Her troponin is negative and EKG is unchanged with 2 days worth of symptoms. D dimer is negative and I feel as though this effectively rules out pulmonary  embolism. Chest x-ray reveals no evidence of pneumonia or heart failure. She will be discharged with anti-inflammatories for when I suspect is a musculoskeletal or even anxiety related discomfort.  I personally performed the services described in this documentation, which was scribed in my presence. The recorded information has been reviewed and is accurate.       Veryl Speak, MD 07/19/15 [phone]

## 2015-07-19 NOTE — Discharge Instructions (Signed)
Ibuprofen 600 mg 3 times daily for the next 5 days.  Return to the ER if symptoms significantly worsen or change.   Nonspecific Chest Pain  Chest pain can be caused by many different conditions. There is always a chance that your pain could be related to something serious, such as a heart attack or a blood clot in your lungs. Chest pain can also be caused by conditions that are not life-threatening. If you have chest pain, it is very important to follow up with your health care provider. CAUSES  Chest pain can be caused by:  Heartburn.  Pneumonia or bronchitis.  Anxiety or stress.  Inflammation around your heart (pericarditis) or lung (pleuritis or pleurisy).  A blood clot in your lung.  A collapsed lung (pneumothorax). It can develop suddenly on its own (spontaneous pneumothorax) or from trauma to the chest.  Shingles infection (varicella-zoster virus).  Heart attack.  Damage to the bones, muscles, and cartilage that make up your chest wall. This can include:  Bruised bones due to injury.  Strained muscles or cartilage due to frequent or repeated coughing or overwork.  Fracture to one or more ribs.  Sore cartilage due to inflammation (costochondritis). RISK FACTORS  Risk factors for chest pain may include:  Activities that increase your risk for trauma or injury to your chest.  Respiratory infections or conditions that cause frequent coughing.  Medical conditions or overeating that can cause heartburn.  Heart disease or family history of heart disease.  Conditions or health behaviors that increase your risk of developing a blood clot.  Having had chicken pox (varicella zoster). SIGNS AND SYMPTOMS Chest pain can feel like:  Burning or tingling on the surface of your chest or deep in your chest.  Crushing, pressure, aching, or squeezing pain.  Dull or sharp pain that is worse when you move, cough, or take a deep breath.  Pain that is also felt in your back,  neck, shoulder, or arm, or pain that spreads to any of these areas. Your chest pain may come and go, or it may stay constant. DIAGNOSIS Lab tests or other studies may be needed to find the cause of your pain. Your health care provider may have you take a test called an ambulatory ECG (electrocardiogram). An ECG records your heartbeat patterns at the time the test is performed. You may also have other tests, such as:  Transthoracic echocardiogram (TTE). During echocardiography, sound waves are used to create a picture of all of the heart structures and to look at how blood flows through your heart.  Transesophageal echocardiogram (TEE).This is a more advanced imaging test that obtains images from inside your body. It allows your health care provider to see your heart in finer detail.  Cardiac monitoring. This allows your health care provider to monitor your heart rate and rhythm in real time.  Holter monitor. This is a portable device that records your heartbeat and can help to diagnose abnormal heartbeats. It allows your health care provider to track your heart activity for several days, if needed.  Stress tests. These can be done through exercise or by taking medicine that makes your heart beat more quickly.  Blood tests.  Imaging tests. TREATMENT  Your treatment depends on what is causing your chest pain. Treatment may include:  Medicines. These may include:  Acid blockers for heartburn.  Anti-inflammatory medicine.  Pain medicine for inflammatory conditions.  Antibiotic medicine, if an infection is present.  Medicines to dissolve blood clots.  Medicines to treat coronary artery disease.  Supportive care for conditions that do not require medicines. This may include:  Resting.  Applying heat or cold packs to injured areas.  Limiting activities until pain decreases. HOME CARE INSTRUCTIONS  If you were prescribed an antibiotic medicine, finish it all even if you start to  feel better.  Avoid any activities that bring on chest pain.  Do not use any tobacco products, including cigarettes, chewing tobacco, or electronic cigarettes. If you need help quitting, ask your health care provider.  Do not drink alcohol.  Take medicines only as directed by your health care provider.  Keep all follow-up visits as directed by your health care provider. This is important. This includes any further testing if your chest pain does not go away.  If heartburn is the cause for your chest pain, you may be told to keep your head raised (elevated) while sleeping. This reduces the chance that acid will go from your stomach into your esophagus.  Make lifestyle changes as directed by your health care provider. These may include:  Getting regular exercise. Ask your health care provider to suggest some activities that are safe for you.  Eating a heart-healthy diet. A registered dietitian can help you to learn healthy eating options.  Maintaining a healthy weight.  Managing diabetes, if necessary.  Reducing stress. SEEK MEDICAL CARE IF:  Your chest pain does not go away after treatment.  You have a rash with blisters on your chest.  You have a fever. SEEK IMMEDIATE MEDICAL CARE IF:   Your chest pain is worse.  You have an increasing cough, or you cough up blood.  You have severe abdominal pain.  You have severe weakness.  You faint.  You have chills.  You have sudden, unexplained chest discomfort.  You have sudden, unexplained discomfort in your arms, back, neck, or jaw.  You have shortness of breath at any time.  You suddenly start to sweat, or your skin gets clammy.  You feel nauseous or you vomit.  You suddenly feel light-headed or dizzy.  Your heart begins to beat quickly, or it feels like it is skipping beats. These symptoms may represent a serious problem that is an emergency. Do not wait to see if the symptoms will go away. Get medical help right  away. Call your local emergency services (911 in the [place] Do not drive yourself to the hospital.   This information is not intended to replace advice given to you by your health care provider. Make sure you discuss any questions you have with your health care provider.   Document Released: 12/07/2004 Document Revised: 03/20/2014 Document Reviewed: 10/03/2013 Elsevier Interactive Patient Education [employer].

## 2015-08-10 ENCOUNTER — Telehealth: Payer: Self-pay | Admitting: *Deleted

## 2015-08-10 ENCOUNTER — Ambulatory Visit: Payer: PRIVATE HEALTH INSURANCE | Admitting: Neurology

## 2015-08-10 NOTE — Telephone Encounter (Signed)
no showed f/u 

## 2015-08-11 ENCOUNTER — Encounter: Payer: Self-pay | Admitting: Neurology

## 2015-08-15 ENCOUNTER — Encounter: Payer: Self-pay | Admitting: Neurology

## 2015-09-02 ENCOUNTER — Encounter (HOSPITAL_COMMUNITY): Payer: Self-pay

## 2015-09-02 ENCOUNTER — Emergency Department (HOSPITAL_COMMUNITY)
Admission: EM | Admit: 2015-09-02 | Discharge: 2015-09-02 | Disposition: A | Discharge status: Home / Self Care | Payer: Commercial Managed Care - HMO | Attending: Emergency Medicine | Admitting: Emergency Medicine

## 2015-09-02 ENCOUNTER — Emergency Department (HOSPITAL_COMMUNITY): Payer: Commercial Managed Care - HMO

## 2015-09-02 ENCOUNTER — Other Ambulatory Visit: Payer: Self-pay

## 2015-09-02 DIAGNOSIS — R519 Headache, unspecified: Secondary | ICD-10-CM

## 2015-09-02 DIAGNOSIS — I10 Essential (primary) hypertension: Secondary | ICD-10-CM | POA: Diagnosis not present

## 2015-09-02 DIAGNOSIS — J45909 Unspecified asthma, uncomplicated: Secondary | ICD-10-CM | POA: Diagnosis not present

## 2015-09-02 DIAGNOSIS — E1165 Type 2 diabetes mellitus with hyperglycemia: Secondary | ICD-10-CM | POA: Diagnosis not present

## 2015-09-02 DIAGNOSIS — R079 Chest pain, unspecified: Secondary | ICD-10-CM | POA: Diagnosis present

## 2015-09-02 DIAGNOSIS — R51 Headache: Secondary | ICD-10-CM

## 2015-09-02 DIAGNOSIS — Z79899 Other long term (current) drug therapy: Secondary | ICD-10-CM | POA: Insufficient documentation

## 2015-09-02 DIAGNOSIS — R42 Dizziness and giddiness: Secondary | ICD-10-CM | POA: Diagnosis not present

## 2015-09-02 DIAGNOSIS — R739 Hyperglycemia, unspecified: Secondary | ICD-10-CM

## 2015-09-02 LAB — URINALYSIS, ROUTINE W REFLEX MICROSCOPIC
BILIRUBIN URINE: NEGATIVE
Glucose, UA: >1000 mg/dL — AB
HGB URINE DIPSTICK: NEGATIVE
Ketones, ur: NEGATIVE mg/dL
Leukocytes, UA: NEGATIVE
Nitrite: NEGATIVE
Protein, ur: NEGATIVE mg/dL
Specific Gravity, Urine: 1.034 — ABNORMAL HIGH (ref 1.005–1.030)
pH: 5.5 (ref 5.0–8.0)

## 2015-09-02 LAB — BASIC METABOLIC PANEL
ANION GAP: 9 (ref 5–15)
BUN: 13 mg/dL (ref 6–20)
CALCIUM: 8.4 mg/dL — AB (ref 8.9–10.3)
CO2: 28 mmol/L (ref 22–32)
Chloride: 98 mmol/L — ABNORMAL LOW (ref 101–111)
Creatinine, Ser: 0.88 mg/dL (ref 0.44–1.00)
GFR calc Af Amer: >60 mL/min (ref >60)
GFR calc non Af Amer: >60 mL/min (ref >60)
Glucose, Bld: 396 mg/dL — ABNORMAL HIGH (ref 65–99)
Potassium: 3.4 mmol/L — ABNORMAL LOW (ref 3.5–5.1)
Sodium: 135 mmol/L (ref 135–145)

## 2015-09-02 LAB — CBC
HCT: 37.1 % (ref 36.0–46.0)
Hemoglobin: 12.3 g/dL (ref 12.0–15.0)
MCH: 26.5 pg (ref 26.0–34.0)
MCHC: 33.2 g/dL (ref 30.0–36.0)
MCV: 79.8 fL (ref 78.0–100.0)
PLATELETS: 283 10*3/uL (ref 150–400)
RBC: 4.65 MIL/uL (ref 3.87–5.11)
RDW: 14 % (ref 11.5–15.5)
WBC: 6.7 10*3/uL (ref 4.0–10.5)

## 2015-09-02 LAB — URINE MICROSCOPIC-ADD ON
BACTERIA UA: NONE SEEN
RBC / HPF: NONE SEEN RBC/hpf (ref 0–5)
SQUAMOUS EPITHELIAL / LPF: NONE SEEN
WBC, UA: NONE SEEN WBC/hpf (ref 0–5)

## 2015-09-02 LAB — CBG MONITORING, ED
GLUCOSE-CAPILLARY: 404 mg/dL — AB (ref 65–99)
GLUCOSE-CAPILLARY: 176 mg/dL — AB (ref 65–99)

## 2015-09-02 LAB — I-STAT TROPONIN, ED: Troponin i, poc: 0.01 ng/mL (ref 0.00–0.08)

## 2015-09-02 MED ORDER — INSULIN PEN NEEDLE 32G X 4 MM MISC: Status: AC

## 2015-09-02 MED ORDER — INSULIN ASPART 100 UNIT/ML ~~LOC~~ SOLN
10.0000 [IU] | Freq: Once | SUBCUTANEOUS | Status: AC
  Administered 2015-09-02: 10 [IU] via SUBCUTANEOUS
  Filled 2015-09-02: qty 1

## 2015-09-02 MED ORDER — MECLIZINE HCL 25 MG PO TABS
25.0000 mg | ORAL_TABLET | Freq: Once | ORAL | Status: AC
  Administered 2015-09-02: 25 mg via ORAL
  Filled 2015-09-02: qty 1

## 2015-09-02 MED ORDER — SODIUM CHLORIDE 0.9 % IV BOLUS (SEPSIS)
1000.0000 mL | Freq: Once | INTRAVENOUS | Status: AC
  Administered 2015-09-02: 1000 mL via INTRAVENOUS

## 2015-09-02 MED ORDER — MECLIZINE HCL 25 MG PO TABS: 25.0000 mg | ORAL_TABLET | Freq: Three times a day (TID) | ORAL | Status: AC | PRN

## 2015-09-02 MED ORDER — BASAGLAR KWIKPEN 100 UNIT/ML ~~LOC~~ SOPN: 10.0000 [IU] | PEN_INJECTOR | Freq: Every day | SUBCUTANEOUS | Status: AC

## 2015-09-02 NOTE — ED Notes (Signed)
CBG: 176 °

## 2015-09-02 NOTE — Discharge Instructions (Signed)
Take meclizine as needed for dizziness.  Take insulin as prescribed.  Use peroxide to irrigate your ear to help decrease ear wax.  Follow up with your doctor for further care.   Dizziness Dizziness is a common problem. It makes you feel unsteady or lightheaded. You may feel like you are about to pass out (faint). Dizziness can lead to injury if you stumble or fall. Anyone can get dizzy, but dizziness is more common in older adults. This condition can be caused by a number of things, including:  Medicines.  Dehydration.  Illness. HOME CARE Following these instructions may help with your condition: Eating and Drinking  Drink enough fluid to keep your pee (urine) clear or pale yellow. This helps to keep you from getting dehydrated. Try to drink more clear fluids, such as water.  Do not drink alcohol.  Limit how much caffeine you drink or eat if told by your doctor.  Limit how much salt you drink or eat if told by your doctor. Activity  Avoid making quick movements.  When you stand up from sitting in a chair, steady yourself until you feel okay.  In the morning, first sit up on the side of the bed. When you feel okay, stand slowly while you hold onto something. Do this until you know that your balance is fine.  Move your legs often if you need to stand in one place for a long time. Tighten and relax your muscles in your legs while you are standing.  Do not drive or use heavy machinery if you feel dizzy.  Avoid bending down if you feel dizzy. Place items in your home so that they are easy for you to reach without leaning over. Lifestyle  Do not use any tobacco products, including cigarettes, chewing tobacco, or electronic cigarettes. If you need help quitting, ask your doctor.  Try to lower your stress level, such as with yoga or meditation. Talk with your doctor if you need help. General Instructions  Watch your dizziness for any changes.  Take medicines only as told by your  doctor. Talk with your doctor if you think that your dizziness is caused by a medicine that you are taking.  Tell a friend or a family member that you are feeling dizzy. If he or she notices any changes in your behavior, have this person call your doctor.  Keep all follow-up visits as told by your doctor. This is important. GET HELP IF:  Your dizziness does not go away.  Your dizziness or light-headedness gets worse.  You feel sick to your stomach (nauseous).  You have trouble hearing.  You have new symptoms.  You are unsteady on your feet or you feel like the room is spinning. GET HELP RIGHT AWAY IF:  You throw up (vomit) or have diarrhea and are unable to eat or drink anything.  You have trouble:  Talking.  Walking.  Swallowing.  Using your arms, hands, or legs.  You feel generally weak.  You are not thinking clearly or you have trouble forming sentences. It may take a friend or family member to notice this.  You have:  Chest pain.  Pain in your belly (abdomen).  Shortness of breath.  Sweating.  Your vision changes.  You are bleeding.  You have a headache.  You have neck pain or a stiff neck.  You have a fever.   This information is not intended to replace advice given to you by your health care provider. Make sure  you discuss any questions you have with your health care provider.   Document Released: 02/16/2011 Document Revised: 07/14/2014 Document Reviewed: 02/23/2014 Elsevier Interactive Patient Education 2016 [employer] Inc[employer]  Hyperglycemia High blood sugar (hyperglycemia) means that the level of sugar in your blood is higher than it should be. Signs of high blood sugar include:  Feeling thirsty.  Frequent peeing (urinating).  Feeling tired or sleepy.  Dry mouth.  Vision changes.  Feeling weak.  Feeling hungry but losing weight.  Numbness and tingling in your hands or feet.  Headache. When you ignore these signs, your blood sugar  may keep going up. These problems may get worse, and other problems may begin. HOME CARE  Check your blood sugars as told by your doctor. Write down the numbers with the date and time.  Take the right amount of insulin or diabetes pills at the right time. Write down the dose with date and time.  Refill your insulin or diabetes pills before running out.  Watch what you eat. Follow your meal plan.  Drink liquids without sugar, such as water. Check with your doctor if you have kidney or heart disease.  Follow your doctor's orders for exercise. Exercise at the same time of day.  Keep your doctor's appointments. GET HELP RIGHT AWAY IF:   You have trouble thinking or are confused.  You have fast breathing with fruity smelling breath.  You pass out (faint).  You have 2 to 3 days of high blood sugars and you do not know why.  You have chest pain.  You are feeling sick to your stomach (nauseous) or throwing up (vomiting).  You have sudden vision changes. MAKE SURE YOU:   Understand these instructions.  Will watch your condition.  Will get help right away if you are not doing well or get worse.   This information is not intended to replace advice given to you by your health care provider. Make sure you discuss any questions you have with your health care provider.   Document Released: 12/25/2008 Document Revised: 03/20/2014 Document Reviewed: 11/03/2014 Elsevier Interactive Patient Education [employer].

## 2015-09-02 NOTE — ED Notes (Signed)
Pt c/o increasing blood sugar and headache x 1 week and central chest pain starting today.  Pain score 7/10.  Pt report similar chest pain previously.  Sts "they couldn't tell me what it was."  Hx of DM.  Pt reports that she is supposed to take insulin, but cannot afford it.  Sts she has not been taking it since February.

## 2015-09-02 NOTE — ED Provider Notes (Signed)
CSN: [id number]     Arrival date & time 09/02/15  1301 History   First MD Initiated Contact with Patient 09/02/15 1746     Chief Complaint  Patient presents with  . Hyperglycemia  . Chest Pain     (Consider location/radiation/quality/duration/timing/severity/associated sxs/prior Treatment) HPI   45 year old female with history of insulin-dependent diabetes which is noncompliant, hypertension, migraine, asthma and obesity presenting with complaints of dizziness. Patient report for the past 3 weeks she has had progressive worsening bouts of dizziness. She described the dizziness as room spinning sensation, initially worsening with position change and now has been persistent. She also complaining of having recurrent right-sided headache for the past 4 days. She described headaches as a sharp throbbing pain behind her right eye intermittent and felt like cluster headache. Rate pain as 7 out of 10. She has history of migraine headache but states that this headache feels different. She endorse having swelling to both ankles and feet for the past several weeks, also having persistent loose stools at least 5 or 6 bouts daily for the past week in which she attributed to her metformin. Today she developing some midsternal chest pain that felt like heartburn lasting for 4 hours and has since resolved. The primary concern is her recurrent dizziness. Furthermore patient mentioned that she has not been taking insulin since February due to financial reason. She denies having polyuria or polydipsia or generalized fatigue. Patient denies having exertional chest pain. Patient states she has otherwise been compliant with her medication with exception of not taking her insulin. No fever, chills, diplopia, change in vision, URI symptoms, shortness of breath, productive cough, abdominal pain, dysuria, focal numbness or weakness, or confusion.  Past Medical History  Diagnosis Date  . Diabetes mellitus without complication  ([place])   . Hypertension   . Asthma   . Heart murmur   . Obesity   . Migraine    Past Surgical History  Procedure Laterality Date  . Tubal ligation    . Leep  1988  . D&c  1992  . Abdominal hysterectomy  2009  . Abscess drainage  2009    after hysterectomy   Family History  Problem Relation Age of Onset  . Cancer Maternal Aunt     Breast  . Hypertension Maternal Grandmother   . Diabetes Neg Hx   . Heart disease Neg Hx   . Hyperlipidemia Neg Hx   . Migraines Neg Hx   . Aneurysm Mother     brain x 4   Social History  Substance Use Topics  . Smoking status: Never Smoker   . Smokeless tobacco: None  . Alcohol Use: Yes     Comment: occasional (holidays)   OB History    Gravida Para Term Preterm AB TAB SAB Ectopic Multiple Living   4 2 2  2  2   2      Review of Systems  All other systems reviewed and are negative.     Allergies  Lisinopril and Penicillins  Home Medications   Prior to Admission medications   Medication Sig Start Date End Date Taking? Authorizing Provider  amLODipine (NORVASC) 10 MG tablet Take 1 tablet (10 mg total) by mouth daily. 03/23/15   Debbrah Alar, NP  canagliflozin (INVOKANA) 100 MG TABS tablet Take 1 tablet (100 mg total) by mouth daily before breakfast. 02/15/15   Debbrah Alar, NP  carvedilol (COREG) 12.5 MG tablet Take 1 tablet (12.5 mg total) by mouth 2 (two) times daily with  a meal. 04/09/15   Debbrah Alar, NP  EPINEPHrine 0.3 mg/0.3 mL IJ SOAJ injection Inject 0.3 mLs (0.3 mg total) into the muscle once. 04/12/15   Debbrah Alar, NP  glyBURIDE (DIABETA) 5 MG tablet Take 1 tablet (5 mg total) by mouth daily with breakfast. 04/27/15   Debbrah Alar, NP  hydrochlorothiazide (HYDRODIURIL) 25 MG tablet Take 0.5 tablets (12.5 mg total) by mouth daily. 03/23/15   Debbrah Alar, NP  Insulin Glargine (BASAGLAR KWIKPEN) 100 UNIT/ML SOPN Inject 0.1 mLs (10 Units total) into the skin at bedtime. 05/31/15   Debbrah Alar, NP  Insulin Pen Needle (NOVOFINE PLUS) 32G X 4 MM MISC Use as directed 03/16/15   Debbrah Alar, NP  predniSONE (DELTASONE) 10 MG tablet 4 tabs by mouth once daily for 5 days 04/12/15   Debbrah Alar, NP  sitaGLIPtin (JANUVIA) 100 MG tablet Take 1 tablet (100 mg total) by mouth daily. 01/26/15   Debbrah Alar, NP  topiramate (TOPAMAX) 25 MG tablet Take 4 tablets (100 mg total) by mouth at bedtime. Start with 1 pill at bed and increase by 1 pill each week until full dose. 05/03/15   Melvenia Beam, MD   BP 170/111 mmHg  Pulse 64  Temp(Src) 98.7 F (37.1 C) (Oral)  Resp 16  Wt 96.616 kg  SpO2 93% Physical Exam  Constitutional: She is oriented to person, place, and time. She appears well-developed and well-nourished. No distress.  Obese African American female resting in bed comfortably in no acute discomfort, nontoxic  HENT:  Head: Atraumatic.  Ears: Cerumen impaction to both ears unable to visualized TM. Nose: Normal nares Throat: Uvula is midline no tonsillar exudates.   Eyes: Conjunctivae and EOM are normal. Pupils are equal, round, and reactive to light.  No nystagmus  Neck: Normal range of motion. Neck supple.  No nuchal rigidity  Cardiovascular: Normal rate and regular rhythm.   Pulmonary/Chest: Effort normal and breath sounds normal. She exhibits no tenderness.  Abdominal: Soft. There is no tenderness.  Musculoskeletal: She exhibits no edema.  Neurological: She is alert and oriented to person, place, and time. She has normal strength. No cranial nerve deficit or sensory deficit. She displays a negative Romberg sign. Coordination and gait normal. GCS eye subscore is 4. GCS verbal subscore is 5. GCS motor subscore is 6.  Skin: No rash noted.  Psychiatric: She has a normal mood and affect.  Nursing note and vitals reviewed.   ED Course  Procedures (including critical care time) Labs Review Labs Reviewed  URINALYSIS, ROUTINE W REFLEX MICROSCOPIC (NOT  AT [facility]) - Abnormal; Notable for the following:    Specific Gravity, Urine 1.034 (*)    Glucose, UA >1000 (*)    All other components within normal limits  BASIC METABOLIC PANEL - Abnormal; Notable for the following:    Potassium 3.4 (*)    Chloride 98 (*)    Glucose, Bld 396 (*)    Calcium 8.4 (*)    All other components within normal limits  CBG MONITORING, ED - Abnormal; Notable for the following:    Glucose-Capillary 404 (*)    All other components within normal limits  CBG MONITORING, ED - Abnormal; Notable for the following:    Glucose-Capillary 176 (*)    All other components within normal limits  CBC  URINE MICROSCOPIC-ADD ON  I-STAT TROPOININ, ED  CBG MONITORING, ED    Imaging Review Dg Chest 2 View  09/02/2015  CLINICAL DATA:  Rt cp,  Hx  of htn, asthma, diabetic, nonsmoker EXAM: CHEST  2 VIEW COMPARISON:  07/19/2015 FINDINGS: Shallow lung inflation. Heart size is accentuated by low lung volume and is upper limits normal in size. No focal consolidations or pleural effusions. No pulmonary edema. IMPRESSION: No evidence for acute  abnormality. Electronically Signed   By: Nolon Nations M.D.   On: 09/02/2015 13:59   I have personally reviewed and evaluated these images and lab results as part of my medical decision-making.   EKG Interpretation   Date/Time:  Thursday September 02 2015 13:30:54 EDT Ventricular Rate:  75 PR Interval:    QRS Duration: 95 QT Interval:  393 QTC Calculation: 439 R Axis:   -53 Text Interpretation:  Sinus rhythm Inferior infarct, old Consider anterior  infarct When compared with ECG of 07/19/2015, No significant change was  found Confirmed by [facility]  MD, DAVID ([id number]) on 09/02/2015 1:34:44 PM      MDM   Final diagnoses:  Hyperglycemia  Vertigo  Bad headache    BP 152/81 mmHg  Pulse 74  Temp(Src) 98.7 F (37.1 C) (Oral)  Resp 16  Wt 96.616 kg  SpO2 98%   6:09 PM Patient here with persistent dizziness. She has no focal neuro deficit  on exam. Normal HINTs exam. Ambulate without difficulty. She developed midsternal chest pain likely current, no active chest pain at this time. Her headache has been ongoing for nearly a week. History of migraine. No concerning feature to suggest subarachnoid hemorrhage, meningitis, or malignancy however she came in initially with blood pressure of 205/106, therefore I will obtain head CT scan for my suspicion for subarachnoid hemorrhage is low. Workup initiated.  8:24 PM Patient blood pressure stabilized. Head CT scan is unremarkable, chest x-ray without signs of infection. CBG improves markedly after receiving IV fluid and insulin. Meclizine was given for dizziness. At this time patient states she felt much better, ambulate without any difficulty, stable for discharge. I recommend using peroxide help with cerumen impaction. Furthermore, I also refilled her insulin and encouraged patient to be compliant with medication. She will need to follow-up closely with her PCP for further care. I have low suspicion for ACS or other acute emergent medical pathology. Low suspicion for DKA. Return precaution discussed.   Domenic Moras, PA-C 09/02/15 2026  Davonna Belling, MD 09/04/15 [phone]

## 2015-09-03 ENCOUNTER — Telehealth: Payer: Self-pay | Admitting: Family

## 2015-09-03 NOTE — Telephone Encounter (Signed)
Please contact pt to arrange ED follow up with me.  

## 2015-09-06 NOTE — Telephone Encounter (Signed)
"  Person you are trying to reach is not accepting calls"  Please attempt to schedule pt for ED f/u with [facility]

## 2015-09-13 ENCOUNTER — Other Ambulatory Visit: Payer: Self-pay | Admitting: Obstetrics & Gynecology

## 2015-09-15 ENCOUNTER — Telehealth: Payer: Self-pay | Admitting: Family

## 2015-09-15 NOTE — Telephone Encounter (Signed)
lvm for pt informing that a hospital F/U is needed.

## 2015-09-28 NOTE — Telephone Encounter (Signed)
Completed.

## 2016-02-15 ENCOUNTER — Emergency Department (HOSPITAL_COMMUNITY)
Admission: EM | Admit: 2016-02-15 | Discharge: 2016-02-15 | Disposition: A | Discharge status: Home / Self Care | Payer: 59 | Attending: Emergency Medicine | Admitting: Emergency Medicine

## 2016-02-15 ENCOUNTER — Emergency Department (HOSPITAL_COMMUNITY): Payer: 59

## 2016-02-15 ENCOUNTER — Encounter (HOSPITAL_COMMUNITY): Payer: Self-pay | Admitting: Emergency Medicine

## 2016-02-15 DIAGNOSIS — E119 Type 2 diabetes mellitus without complications: Secondary | ICD-10-CM | POA: Diagnosis not present

## 2016-02-15 DIAGNOSIS — J45909 Unspecified asthma, uncomplicated: Secondary | ICD-10-CM | POA: Diagnosis not present

## 2016-02-15 DIAGNOSIS — Y939 Activity, unspecified: Secondary | ICD-10-CM | POA: Insufficient documentation

## 2016-02-15 DIAGNOSIS — I10 Essential (primary) hypertension: Secondary | ICD-10-CM | POA: Insufficient documentation

## 2016-02-15 DIAGNOSIS — Y929 Unspecified place or not applicable: Secondary | ICD-10-CM | POA: Insufficient documentation

## 2016-02-15 DIAGNOSIS — X509XXA Other and unspecified overexertion or strenuous movements or postures, initial encounter: Secondary | ICD-10-CM | POA: Insufficient documentation

## 2016-02-15 DIAGNOSIS — Z794 Long term (current) use of insulin: Secondary | ICD-10-CM | POA: Diagnosis not present

## 2016-02-15 DIAGNOSIS — Z79899 Other long term (current) drug therapy: Secondary | ICD-10-CM | POA: Insufficient documentation

## 2016-02-15 DIAGNOSIS — Y999 Unspecified external cause status: Secondary | ICD-10-CM | POA: Diagnosis not present

## 2016-02-15 DIAGNOSIS — M25572 Pain in left ankle and joints of left foot: Secondary | ICD-10-CM | POA: Diagnosis present

## 2016-02-15 MED ORDER — HYDROCHLOROTHIAZIDE 12.5 MG PO CAPS
25.0000 mg | ORAL_CAPSULE | Freq: Once | ORAL | Status: DC
  Filled 2016-02-15: qty 2

## 2016-02-15 MED ORDER — AMLODIPINE BESYLATE 5 MG PO TABS
10.0000 mg | ORAL_TABLET | Freq: Once | ORAL | Status: AC
  Administered 2016-02-15: 10 mg via ORAL
  Filled 2016-02-15: qty 2

## 2016-02-15 MED ORDER — TRAMADOL HCL 50 MG PO TABS: 50.0000 mg | ORAL_TABLET | Freq: Four times a day (QID) | ORAL | 0 refills | Status: AC | PRN

## 2016-02-15 MED ORDER — CARVEDILOL 12.5 MG PO TABS
12.5000 mg | ORAL_TABLET | Freq: Two times a day (BID) | ORAL | Status: DC
  Filled 2016-02-15: qty 1

## 2016-02-15 MED ORDER — CARVEDILOL 12.5 MG PO TABS: 12.5000 mg | ORAL_TABLET | Freq: Two times a day (BID) | ORAL | 0 refills | Status: DC

## 2016-02-15 MED ORDER — AMLODIPINE BESYLATE 10 MG PO TABS
10.0000 mg | ORAL_TABLET | Freq: Every day | ORAL | 0 refills | Status: DC
Start: 2016-02-15 — End: 2016-07-12

## 2016-02-15 MED ORDER — HYDROCHLOROTHIAZIDE 12.5 MG PO CAPS
12.5000 mg | ORAL_CAPSULE | Freq: Once | ORAL | Status: AC
  Administered 2016-02-15: 12.5 mg via ORAL

## 2016-02-15 MED ORDER — HYDROCHLOROTHIAZIDE 12.5 MG PO TABS: 12.5000 mg | ORAL_TABLET | Freq: Every day | ORAL | 0 refills | Status: DC

## 2016-02-15 MED ORDER — HYDROCODONE-ACETAMINOPHEN 5-325 MG PO TABS
1.0000 | ORAL_TABLET | Freq: Once | ORAL | Status: AC
  Administered 2016-02-15: 1 via ORAL
  Filled 2016-02-15: qty 1

## 2016-02-15 NOTE — ED Provider Notes (Signed)
[facility] DEPT Provider Note   CSN: [id number] Arrival date & time: 02/15/16  1306  By signing my name below, I, Judithe Modest, attest that this documentation has been prepared under the direction and in the presence of Gloriann Loan, PA-C. Electronically Signed: Judithe Modest, ER Scribe. 10/23/2015. 1:32 PM.  History   Chief Complaint Chief Complaint  Patient presents with  . Ankle Pain    Left   HPI  HPI Comments: Margaret Meyers is a 45 y.o. female who presents to the Emergency Department complaining of sudden onset, constant, severe left foot pain and swelling since last night when she stepped down off a chair. She states when she stepped down off the chair she heard a popping sound. No medications prior to arrival. She states she has not been able to bear weight today. She also noted she is out of her blood pressure medications. She denies any chest pain, shortness of breath, confusion, headache.   Past Medical History:  Diagnosis Date  . Asthma   . Diabetes mellitus without complication ([place])   . Heart murmur   . Hypertension   . Migraine   . Obesity     Patient Active Problem List   Diagnosis Date Noted  . Angioedema 04/09/2015  . Headache 01/04/2015  . HTN (hypertension), malignant 01/04/2015  . DM (diabetes mellitus) ([place]) 01/04/2015  . Obesity 01/04/2015    Past Surgical History:  Procedure Laterality Date  . ABDOMINAL HYSTERECTOMY  2009  . ABSCESS DRAINAGE  2009   after hysterectomy  . D&C  1992  . LEEP  1988  . TUBAL LIGATION      OB History    Gravida Para Term Preterm AB Living   4 2 2   2 2    SAB TAB Ectopic Multiple Live Births   2               Home Medications    Prior to Admission medications   Medication Sig Start Date End Date Taking? Authorizing Provider  amLODipine (NORVASC) 10 MG tablet Take 1 tablet (10 mg total) by mouth daily. Patient not taking: Reported on 09/02/2015 03/23/15   Debbrah Alar, NP  amLODipine  (NORVASC) 10 MG tablet Take 1 tablet (10 mg total) by mouth daily. 02/15/16   Gloriann Loan, PA-C  canagliflozin (INVOKANA) 100 MG TABS tablet Take 1 tablet (100 mg total) by mouth daily before breakfast. Patient not taking: Reported on 09/02/2015 02/15/15   Debbrah Alar, NP  carvedilol (COREG) 12.5 MG tablet Take 1 tablet (12.5 mg total) by mouth 2 (two) times daily with a meal. 04/09/15   Debbrah Alar, NP  carvedilol (COREG) 12.5 MG tablet Take 1 tablet (12.5 mg total) by mouth 2 (two) times daily with a meal. 02/15/16   Gloriann Loan, PA-C  EPINEPHrine 0.3 mg/0.3 mL IJ SOAJ injection Inject 0.3 mLs (0.3 mg total) into the muscle once. Patient not taking: Reported on 09/02/2015 04/12/15   Debbrah Alar, NP  glyBURIDE (DIABETA) 5 MG tablet Take 1 tablet (5 mg total) by mouth daily with breakfast. 04/27/15   Debbrah Alar, NP  hydrochlorothiazide (HYDRODIURIL) 12.5 MG tablet Take 1 tablet (12.5 mg total) by mouth daily. 02/15/16   Gloriann Loan, PA-C  hydrochlorothiazide (HYDRODIURIL) 25 MG tablet Take 0.5 tablets (12.5 mg total) by mouth daily. 03/23/15   Debbrah Alar, NP  Insulin Glargine (BASAGLAR KWIKPEN) 100 UNIT/ML SOPN Inject 0.1 mLs (10 Units total) into the skin at bedtime. 09/02/15   Gertie Fey  Rona Ravens, PA-C  Insulin Pen Needle (NOVOFINE PLUS) 32G X 4 MM MISC Use as directed 09/02/15   Domenic Moras, PA-C  meclizine (ANTIVERT) 25 MG tablet Take 1 tablet (25 mg total) by mouth 3 (three) times daily as needed for dizziness. 09/02/15   Domenic Moras, PA-C  predniSONE (DELTASONE) 10 MG tablet 4 tabs by mouth once daily for 5 days Patient not taking: Reported on 09/02/2015 04/12/15   Debbrah Alar, NP  sitaGLIPtin (JANUVIA) 100 MG tablet Take 1 tablet (100 mg total) by mouth daily. Patient not taking: Reported on 09/02/2015 01/26/15   Debbrah Alar, NP  topiramate (TOPAMAX) 25 MG tablet Take 4 tablets (100 mg total) by mouth at bedtime. Start with 1 pill at bed and increase by 1 pill each week  until full dose. Patient not taking: Reported on 09/02/2015 05/03/15   Melvenia Beam, MD  traMADol (ULTRAM) 50 MG tablet Take 1 tablet (50 mg total) by mouth every 6 (six) hours as needed. 02/15/16   Gloriann Loan, PA-C    Family History Family History  Problem Relation Age of Onset  . Cancer Maternal Aunt     Breast  . Hypertension Maternal Grandmother   . Aneurysm Mother     brain x 4  . Diabetes Neg Hx   . Heart disease Neg Hx   . Hyperlipidemia Neg Hx   . Migraines Neg Hx     Social History Social History  Substance Use Topics  . Smoking status: Never Smoker  . Smokeless tobacco: Never Used  . Alcohol use Yes     Comment: occasional (holidays)     Allergies   Lisinopril and Penicillins   Review of Systems Review of Systems  Constitutional: Negative for chills and fever.  Musculoskeletal: Positive for arthralgias and gait problem.  Skin: Negative for color change and wound.  Neurological: Negative for weakness and numbness.     Physical Exam Updated Vital Signs BP (!) 195/110 (BP Location: Left Arm)   Pulse 80   Temp 98.4 F (36.9 C) (Oral)   Resp 18   Ht 4\' 8"  (1.422 m)   Wt 96.6 kg   SpO2 98%   BMI 47.75 kg/m   Physical Exam  Constitutional: She is oriented to person, place, and time. She appears well-developed and well-nourished. No distress.  HENT:  Head: Normocephalic and atraumatic.  Eyes: Pupils are equal, round, and reactive to light.  Neck: Neck supple.  Cardiovascular:  Pulses:      Dorsalis pedis pulses are 2+ on the right side, and 2+ on the left side.  Brisk capillary refill.  Pulmonary/Chest: Effort normal. No respiratory distress.  Musculoskeletal: Normal range of motion. She exhibits tenderness.  Left ankle: Moderate swelling without bruising, erythema, or deformity. Diffuse tenderness over lateral malleolus and deltoid ligament. Compartment is soft and compressible.  Neurological: She is alert and oriented to person, place, and time.  Coordination normal.  Normal strength and sensation in bilateral lower extremities.  Skin: Skin is warm and dry. She is not diaphoretic.  Psychiatric: She has a normal mood and affect. Her behavior is normal.  Nursing note and vitals reviewed.    ED Treatments / Results  Labs (all labs ordered are listed, but only abnormal results are displayed) Labs Reviewed - No data to display  EKG  EKG Interpretation None       Radiology Dg Ankle Complete Left  Result Date: 02/15/2016 CLINICAL DATA:  Ankle pain following stepping from chair, initial encounter EXAM:  LEFT ANKLE COMPLETE - 3+ VIEW COMPARISON:  None FINDINGS: Well corticated bony density is noted adjacent to the distal fibula consistent with prior trauma and nonunion. No acute fracture or dislocation is seen. No gross soft tissue abnormality is noted. Small calcaneal spurring is noted. IMPRESSION: Chronic changes without acute abnormality. Electronically Signed   By: Inez Catalina M.D.   On: 02/15/2016 14:09    Procedures Procedures (including critical care time)  Medications Ordered in ED Medications  carvedilol (COREG) tablet 12.5 mg (not administered)  amLODipine (NORVASC) tablet 10 mg (10 mg Oral Given 02/15/16 1426)  HYDROcodone-acetaminophen (NORCO/VICODIN) 5-325 MG per tablet 1 tablet (1 tablet Oral Given 02/15/16 1426)  hydrochlorothiazide (MICROZIDE) capsule 12.5 mg (12.5 mg Oral Given 02/15/16 1426)     Initial Impression / Assessment and Plan / ED Course  I have reviewed the triage vital signs and the nursing notes.  Pertinent labs & imaging results that were available during my care of the patient were reviewed by me and considered in my medical decision making (see chart for details).  Clinical Course    Patient X-Ray negative for obvious fracture or dislocation.  Pt advised to follow up with orthopedics. Conservative therapy recommended and discussed. BP elevated, patient has been out of her medications. She is  asymptomatic at this time. Refills provided. Discussed signs and symptoms that warrant reevaluation in ED including chest pain, shortness of breath, confusion, or headache. Patient will be discharged home & is agreeable with above plan. Returns precautions discussed. Pt appears safe for discharge.  Final Clinical Impressions(s) / ED Diagnoses   Final diagnoses:  Acute left ankle pain  Hypertension, unspecified type    New Prescriptions New Prescriptions   AMLODIPINE (NORVASC) 10 MG TABLET    Take 1 tablet (10 mg total) by mouth daily.   CARVEDILOL (COREG) 12.5 MG TABLET    Take 1 tablet (12.5 mg total) by mouth 2 (two) times daily with a meal.   HYDROCHLOROTHIAZIDE (HYDRODIURIL) 12.5 MG TABLET    Take 1 tablet (12.5 mg total) by mouth daily.   TRAMADOL (ULTRAM) 50 MG TABLET    Take 1 tablet (50 mg total) by mouth every 6 (six) hours as needed.    I personally performed the services described in this documentation, which was scribed in my presence. The recorded information has been reviewed and is accurate.       Gloriann Loan, PA-C 02/15/16 [place], MD 02/17/16 1146

## 2016-02-15 NOTE — ED Triage Notes (Signed)
Patient reports she stepped down off of a chair last night. Patient states she felt her left foot "crunch." Patient is complaining of left foot pain and swelling.

## 2016-02-15 NOTE — Discharge Instructions (Signed)
Continue taking her blood pressure medications. Please follow-up with your primary care physician for blood pressure recheck. Return immediately to the emergency department if you experience chest pain, shortness of breath, confusion, or headache. Start taking tramadol every 6 hours as needed for your ankle pain. If your symptoms persist, please follow-up with orthopedics. Return to the emergency department for sudden worsening pain, numbness, weakness, or any new or concerning symptoms.

## 2016-03-09 ENCOUNTER — Emergency Department (HOSPITAL_COMMUNITY): Payer: 59

## 2016-03-09 ENCOUNTER — Encounter (HOSPITAL_COMMUNITY): Payer: Self-pay

## 2016-03-09 ENCOUNTER — Emergency Department (HOSPITAL_COMMUNITY)
Admission: EM | Admit: 2016-03-09 | Discharge: 2016-03-09 | Disposition: A | Discharge status: Home / Self Care | Payer: 59 | Attending: Emergency Medicine | Admitting: Emergency Medicine

## 2016-03-09 DIAGNOSIS — Z79899 Other long term (current) drug therapy: Secondary | ICD-10-CM | POA: Insufficient documentation

## 2016-03-09 DIAGNOSIS — I10 Essential (primary) hypertension: Secondary | ICD-10-CM | POA: Insufficient documentation

## 2016-03-09 DIAGNOSIS — J45901 Unspecified asthma with (acute) exacerbation: Secondary | ICD-10-CM | POA: Insufficient documentation

## 2016-03-09 DIAGNOSIS — J988 Other specified respiratory disorders: Secondary | ICD-10-CM

## 2016-03-09 DIAGNOSIS — J069 Acute upper respiratory infection, unspecified: Secondary | ICD-10-CM | POA: Insufficient documentation

## 2016-03-09 DIAGNOSIS — E119 Type 2 diabetes mellitus without complications: Secondary | ICD-10-CM | POA: Insufficient documentation

## 2016-03-09 DIAGNOSIS — B9789 Other viral agents as the cause of diseases classified elsewhere: Secondary | ICD-10-CM

## 2016-03-09 DIAGNOSIS — J45909 Unspecified asthma, uncomplicated: Secondary | ICD-10-CM | POA: Diagnosis present

## 2016-03-09 MED ORDER — PREDNISONE 20 MG PO TABS
60.0000 mg | ORAL_TABLET | Freq: Once | ORAL | Status: AC
  Administered 2016-03-09: 60 mg via ORAL
  Filled 2016-03-09: qty 3

## 2016-03-09 MED ORDER — ALBUTEROL SULFATE (2.5 MG/3ML) 0.083% IN NEBU
INHALATION_SOLUTION | RESPIRATORY_TRACT | Status: AC
  Filled 2016-03-09: qty 6

## 2016-03-09 MED ORDER — ALBUTEROL SULFATE (2.5 MG/3ML) 0.083% IN NEBU
5.0000 mg | INHALATION_SOLUTION | Freq: Once | RESPIRATORY_TRACT | Status: AC
  Administered 2016-03-09: 5 mg via RESPIRATORY_TRACT

## 2016-03-09 MED ORDER — ALBUTEROL SULFATE (2.5 MG/3ML) 0.083% IN NEBU
5.0000 mg | INHALATION_SOLUTION | Freq: Once | RESPIRATORY_TRACT | Status: AC
  Administered 2016-03-09: 5 mg via RESPIRATORY_TRACT
  Filled 2016-03-09: qty 6

## 2016-03-09 MED ORDER — HYDROCOD POLST-CPM POLST ER 10-8 MG/5ML PO SUER: 5.0000 mL | Freq: Two times a day (BID) | ORAL | 0 refills | Status: AC | PRN

## 2016-03-09 MED ORDER — HYDROCOD POLST-CPM POLST ER 10-8 MG/5ML PO SUER
5.0000 mL | Freq: Once | ORAL | Status: AC
  Administered 2016-03-09: 5 mL via ORAL
  Filled 2016-03-09: qty 5

## 2016-03-09 MED ORDER — PREDNISONE 10 MG (21) PO TBPK: 10.0000 mg | ORAL_TABLET | Freq: Every day | ORAL | 0 refills | Status: DC

## 2016-03-09 MED ORDER — IPRATROPIUM BROMIDE 0.02 % IN SOLN
0.5000 mg | Freq: Once | RESPIRATORY_TRACT | Status: AC
  Administered 2016-03-09: 0.5 mg via RESPIRATORY_TRACT
  Filled 2016-03-09: qty 2.5

## 2016-03-09 MED ORDER — ALBUTEROL SULFATE HFA 108 (90 BASE) MCG/ACT IN AERS
2.0000 | INHALATION_SPRAY | Freq: Once | RESPIRATORY_TRACT | Status: AC
  Administered 2016-03-09: 2 via RESPIRATORY_TRACT
  Filled 2016-03-09: qty 6.7

## 2016-03-09 NOTE — ED Provider Notes (Signed)
[facility] DEPT Provider Note   CSN: [id number] Arrival date & time: 03/09/16  [id number]    By signing my name below, I, Macon Large, attest that this documentation has been prepared under the direction and in the presence of Clayton Bibles, PA-C Electronically Signed: Macon Large, ED Scribe. 03/09/16. 10:18 AM.  History   Chief Complaint Chief Complaint  Patient presents with  . URI  . Asthma   The history is provided by the patient. No language interpreter was used.  Asthma  Associated symptoms include shortness of breath. Pertinent negatives include no headaches.   HPI Comments: Margaret Meyers is a 45 y.o. female with PMHx of asthma, DM and HTN, who presents to the Emergency Department complaining of moderate, constant, sore throat onset four days ago. Pt reports associated persistent cough, nose bleeds and fever. Pt's temperature in the ED today was 98. She notes having a fever of 102.3 at home. She also notes having some mucous with her cough. She describes the mucous as a "yellowish-brownish" color. Pt notes at about ~3am this morning, she began to feel out of breathe and went into an asthma attack. Pt notes feeling sore from coughing. She reports using her albuterol inhaler at home with no relief. Pt states she has not had an asthma attack all year. Pt denies HA, nausea, vomiting, ear pain. She denies getting a flu shot this year. Pt notes multiple recent sick contacts at work.    Past Medical History:  Diagnosis Date  . Asthma   . Diabetes mellitus without complication ([place])   . Heart murmur   . Hypertension   . Migraine   . Obesity     Patient Active Problem List   Diagnosis Date Noted  . Angioedema 04/09/2015  . Headache 01/04/2015  . HTN (hypertension), malignant 01/04/2015  . DM (diabetes mellitus) ([place]) 01/04/2015  . Obesity 01/04/2015    Past Surgical History:  Procedure Laterality Date  . ABDOMINAL HYSTERECTOMY  2009  . ABSCESS DRAINAGE  2009   after hysterectomy  . D&C  1992  . LEEP  1988  . TUBAL LIGATION      OB History    Gravida Para Term Preterm AB Living   4 2 2   2 2    SAB TAB Ectopic Multiple Live Births   2               Home Medications    Prior to Admission medications   Medication Sig Start Date End Date Taking? Authorizing Provider  amLODipine (NORVASC) 10 MG tablet Take 1 tablet (10 mg total) by mouth daily. Patient not taking: Reported on 09/02/2015 03/23/15   Debbrah Alar, NP  amLODipine (NORVASC) 10 MG tablet Take 1 tablet (10 mg total) by mouth daily. 02/15/16   Gloriann Loan, PA-C  canagliflozin (INVOKANA) 100 MG TABS tablet Take 1 tablet (100 mg total) by mouth daily before breakfast. Patient not taking: Reported on 09/02/2015 02/15/15   Debbrah Alar, NP  carvedilol (COREG) 12.5 MG tablet Take 1 tablet (12.5 mg total) by mouth 2 (two) times daily with a meal. 04/09/15   Debbrah Alar, NP  carvedilol (COREG) 12.5 MG tablet Take 1 tablet (12.5 mg total) by mouth 2 (two) times daily with a meal. 02/15/16   Gloriann Loan, PA-C  chlorpheniramine-HYDROcodone (TUSSIONEX PENNKINETIC ER) 10-8 MG/5ML SUER Take 5 mLs by mouth 2 (two) times daily as needed for cough (and pain). Do not drive while taking this medication 03/09/16   Clayton Bibles,  PA-C  EPINEPHrine 0.3 mg/0.3 mL IJ SOAJ injection Inject 0.3 mLs (0.3 mg total) into the muscle once. Patient not taking: Reported on 09/02/2015 04/12/15   Debbrah Alar, NP  glyBURIDE (DIABETA) 5 MG tablet Take 1 tablet (5 mg total) by mouth daily with breakfast. 04/27/15   Debbrah Alar, NP  hydrochlorothiazide (HYDRODIURIL) 12.5 MG tablet Take 1 tablet (12.5 mg total) by mouth daily. 02/15/16   Gloriann Loan, PA-C  hydrochlorothiazide (HYDRODIURIL) 25 MG tablet Take 0.5 tablets (12.5 mg total) by mouth daily. 03/23/15   Debbrah Alar, NP  Insulin Glargine (BASAGLAR KWIKPEN) 100 UNIT/ML SOPN Inject 0.1 mLs (10 Units total) into the skin at bedtime. 09/02/15   Domenic Moras, PA-C  Insulin Pen Needle (NOVOFINE PLUS) 32G X 4 MM MISC Use as directed 09/02/15   Domenic Moras, PA-C  meclizine (ANTIVERT) 25 MG tablet Take 1 tablet (25 mg total) by mouth 3 (three) times daily as needed for dizziness. 09/02/15   Domenic Moras, PA-C  predniSONE (STERAPRED UNI-PAK 21 TAB) 10 MG (21) TBPK tablet Take 1 tablet (10 mg total) by mouth daily. Day 1: take 6 tabs.  Day 2: 5 tabs  Day 3: 4 tabs  Day 4: 3 tabs  Day 5: 2 tabs  Day 6: 1 tab 03/09/16   Clayton Bibles, PA-C  sitaGLIPtin (JANUVIA) 100 MG tablet Take 1 tablet (100 mg total) by mouth daily. Patient not taking: Reported on 09/02/2015 01/26/15   Debbrah Alar, NP  topiramate (TOPAMAX) 25 MG tablet Take 4 tablets (100 mg total) by mouth at bedtime. Start with 1 pill at bed and increase by 1 pill each week until full dose. Patient not taking: Reported on 09/02/2015 05/03/15   Melvenia Beam, MD  traMADol (ULTRAM) 50 MG tablet Take 1 tablet (50 mg total) by mouth every 6 (six) hours as needed. 02/15/16   Gloriann Loan, PA-C    Family History Family History  Problem Relation Age of Onset  . Cancer Maternal Aunt     Breast  . Hypertension Maternal Grandmother   . Aneurysm Mother     brain x 4  . Diabetes Neg Hx   . Heart disease Neg Hx   . Hyperlipidemia Neg Hx   . Migraines Neg Hx     Social History Social History  Substance Use Topics  . Smoking status: Never Smoker  . Smokeless tobacco: Never Used  . Alcohol use Yes     Comment: occasional (holidays)     Allergies   Lisinopril and Penicillins   Review of Systems Review of Systems  Constitutional: Positive for fever.  HENT: Positive for nosebleeds. Negative for ear pain.   Respiratory: Positive for cough and shortness of breath.   Gastrointestinal: Negative for nausea and vomiting.  Neurological: Negative for headaches.     Physical Exam Updated Vital Signs BP 174/60 (BP Location: Left Arm)   Pulse 100   Temp 98 F (36.7 C) (Oral)   Resp 24   SpO2 98%     Physical Exam  Constitutional: She appears well-developed and well-nourished. No distress.  HENT:  Head: Normocephalic and atraumatic.  Mouth/Throat: Oropharynx is clear and moist. No oropharyngeal exudate.  Eyes: Conjunctivae are normal.  Neck: Neck supple.  Cardiovascular: Normal rate and regular rhythm.   Pulmonary/Chest: Effort normal. No respiratory distress. She has wheezes. She has no rales.  Diffuse wheezing and decreased breath sounds throughout.   Neurological: She is alert.  Skin: She is not diaphoretic.  Nursing note  and vitals reviewed.    ED Treatments / Results   DIAGNOSTIC STUDIES: Oxygen Saturation is 98% on RA, normal by my interpretation.    COORDINATION OF CARE: 10:14 AM Discussed treatment plan with pt at bedside which includes chest imaging, breathing treatment and steriods and pt agreed to plan.   Labs (all labs ordered are listed, but only abnormal results are displayed) Labs Reviewed - No data to display  EKG  EKG Interpretation None       Radiology Dg Chest 2 View  Result Date: 03/09/2016 CLINICAL DATA:  Productive cough, fever, wheezing and chest congestion. EXAM: CHEST  2 VIEW COMPARISON:  09/02/2015 FINDINGS: There is peribronchial thickening. Heart size and pulmonary vascularity are normal. No infiltrates or effusions. No bone abnormality. IMPRESSION: Bronchitic changes. Electronically Signed   By: Lorriane Shire M.D.   On: 03/09/2016 10:37    Procedures Procedures (including critical care time)  Medications Ordered in ED Medications  albuterol (PROVENTIL) (2.5 MG/3ML) 0.083% nebulizer solution 5 mg (5 mg Nebulization Given 03/09/16 0957)  predniSONE (DELTASONE) tablet 60 mg (60 mg Oral Given 03/09/16 1038)  chlorpheniramine-HYDROcodone (TUSSIONEX) 10-8 MG/5ML suspension 5 mL (5 mLs Oral Given 03/09/16 1038)  albuterol (PROVENTIL) (2.5 MG/3ML) 0.083% nebulizer solution 5 mg (5 mg Nebulization Given 03/09/16 1038)  ipratropium  (ATROVENT) nebulizer solution 0.5 mg (0.5 mg Nebulization Given 03/09/16 1038)  albuterol (PROVENTIL) (2.5 MG/3ML) 0.083% nebulizer solution 5 mg (5 mg Nebulization Given 03/09/16 1115)  ipratropium (ATROVENT) nebulizer solution 0.5 mg (0.5 mg Nebulization Given 03/09/16 1115)  albuterol (PROVENTIL HFA;VENTOLIN HFA) 108 (90 Base) MCG/ACT inhaler 2 puff (2 puffs Inhalation Given 03/09/16 1207)     Initial Impression / Assessment and Plan / ED Course  I have reviewed the triage vital signs and the nursing notes.  Pertinent labs & imaging results that were available during my care of the patient were reviewed by me and considered in my medical decision making (see chart for details).  Clinical Course as of Mar 09 1224  Thu Mar 09, 2016  1152 Great improvement following medications.  Moving air well throughout, minimal wheezing.    [EW]    Clinical Course User Index [EW] Clayton Bibles, PA-C    Afebrile, nontoxic patient with constellation of symptoms suggestive of viral syndrome with asthma exacerbation.  CXR negative.  Pt is diabetic and is only taking some of her medications, advised close follow up with PCP and strict adherence to diabetic diet.  Discussed steroid use and close monitoring of blood sugar.  Great improvement with medications in ED.  Discharged home with supportive care, PCP follow up.  Discussed result, findings, treatment, and follow up  with patient.  Pt given return precautions.  Pt verbalizes understanding and agrees with plan.      Final Clinical Impressions(s) / ED Diagnoses   Final diagnoses:  Viral respiratory illness  Exacerbation of asthma, unspecified asthma severity, unspecified whether persistent    New Prescriptions Discharge Medication List as of 03/09/2016 11:57 AM    START taking these medications   Details  chlorpheniramine-HYDROcodone (TUSSIONEX PENNKINETIC ER) 10-8 MG/5ML SUER Take 5 mLs by mouth 2 (two) times daily as needed for cough (and pain). Do  not drive while taking this medication, Starting Thu 03/09/2016, Print    predniSONE (STERAPRED UNI-PAK 21 TAB) 10 MG (21) TBPK tablet Take 1 tablet (10 mg total) by mouth daily. Day 1: take 6 tabs.  Day 2: 5 tabs  Day 3: 4 tabs  Day 4: 3  tabs  Day 5: 2 tabs  Day 6: 1 tab, Starting Thu 03/09/2016, Print        I personally performed the services described in this documentation, which was scribed in my presence. The recorded information has been reviewed and is accurate.     Clayton Bibles, PA-C 03/09/16 [place], MD 03/09/16 [phone]

## 2016-03-09 NOTE — Discharge Instructions (Signed)
Read the information below.  Use the prescribed medication as directed.  Please discuss all new medications with your pharmacist.  You may return to the Emergency Department at any time for worsening condition or any new symptoms that concern you.   If you develop worsening shortness of breath, uncontrolled wheezing, severe chest pain, or fevers despite using tylenol and/or ibuprofen, return for a recheck.     °

## 2016-03-09 NOTE — ED Notes (Signed)
Wheezing notably improved following breathing treatment.

## 2016-03-09 NOTE — ED Triage Notes (Signed)
Pt here for URI symptoms and asthma exacerbation. Pt audibly wheezing on arrival to triage and labored breathing noted. Hx of asthma. Pt states she used her inhaler at home without relief.

## 2016-05-19 ENCOUNTER — Encounter: Payer: Self-pay | Admitting: Family

## 2016-06-05 ENCOUNTER — Other Ambulatory Visit: Payer: Self-pay | Admitting: *Deleted

## 2016-06-05 NOTE — Telephone Encounter (Signed)
Received fax from [employer] requesting order for diabetic testing supplies: meter, alcohol pads, test strips, lancet device/lancest and control solution. We have never sent rx to this company before and pt has not been seen in over 1 yr (04/12/15).  Not sure if pt is seeing endocrinologist? Attempted to reach pt as she will need office visit before orders / Rx can be sent but line was busy. Form placed in blue folder on CMA desk. Will try again later.

## 2016-06-14 NOTE — Telephone Encounter (Signed)
Tired to reach pt again about request from [place] for diabetic testing supplies line is still busy.

## 2016-06-21 NOTE — Telephone Encounter (Signed)
My chart message sent to pt.

## 2016-06-23 NOTE — Telephone Encounter (Signed)
Pt read mychart message on 06/21/16 but has not responded. Rx request shredded.

## 2016-07-11 ENCOUNTER — Encounter: Payer: Self-pay | Admitting: Family

## 2016-07-12 MED ORDER — GLYBURIDE 5 MG PO TABS: 5.0000 mg | ORAL_TABLET | Freq: Every day | ORAL | 0 refills | Status: AC

## 2016-07-12 MED ORDER — AMLODIPINE BESYLATE 10 MG PO TABS: 10.0000 mg | ORAL_TABLET | Freq: Every day | ORAL | 0 refills | Status: AC

## 2016-07-12 MED ORDER — CARVEDILOL 12.5 MG PO TABS: 12.5000 mg | ORAL_TABLET | Freq: Two times a day (BID) | ORAL | 0 refills | Status: AC

## 2016-07-12 MED ORDER — HYDROCHLOROTHIAZIDE 12.5 MG PO TABS: 12.5000 mg | ORAL_TABLET | Freq: Every day | ORAL | 0 refills | Status: AC

## 2016-07-12 NOTE — Telephone Encounter (Signed)
Noted  

## 2016-07-12 NOTE — Telephone Encounter (Signed)
Margaret Meyers-- I sent refills on her meds except the Januvia and Invokana as she says it was too expensive and she never started them. I have advised her to schedule an appointment soon as we will not be able to give her additional refills until she is seen.

## 2016-07-25 ENCOUNTER — Encounter: Payer: Self-pay | Admitting: Family

## 2016-12-16 IMAGING — DX DG CHEST 2V
2 series · 2 of 2 positions shown · non-contrast
Comparison: 04/27/2015

CLINICAL DATA: Mid to left-sided chest pain

EXAM:
CHEST  2 VIEW

[chest pa]
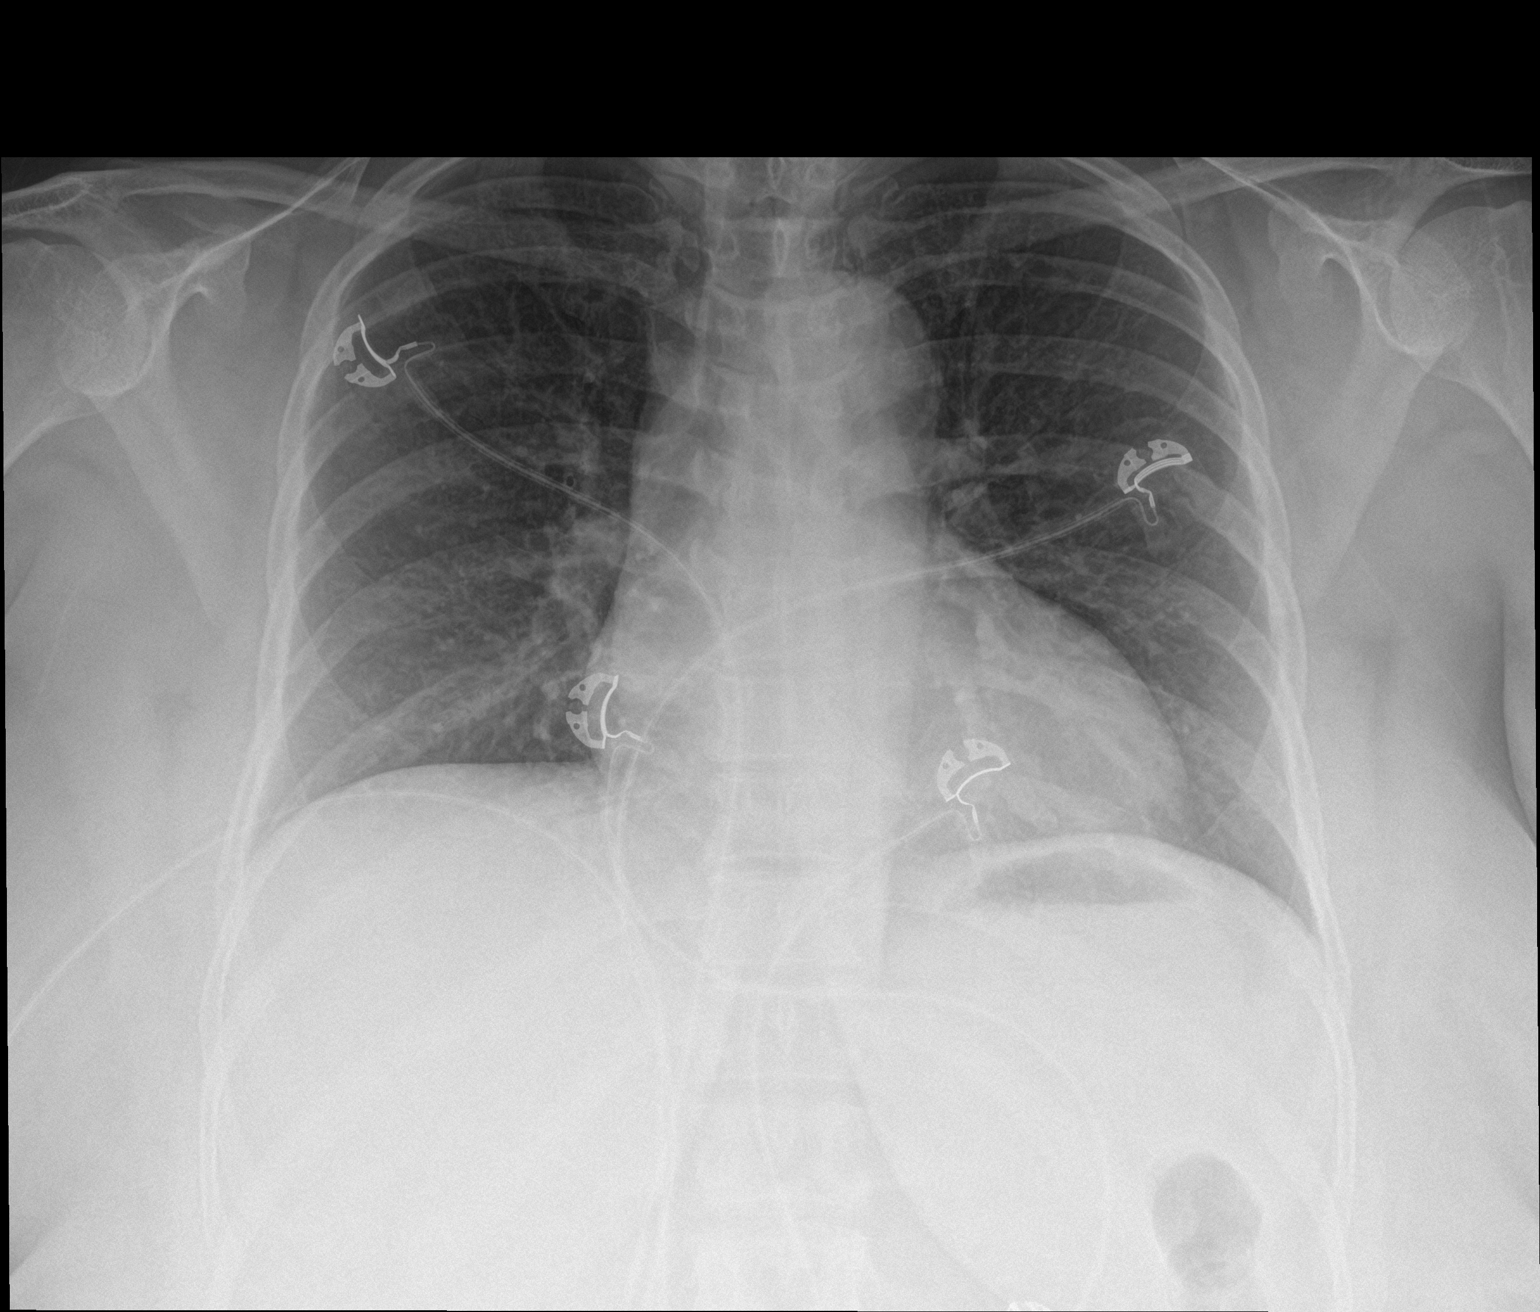

[chest lat]
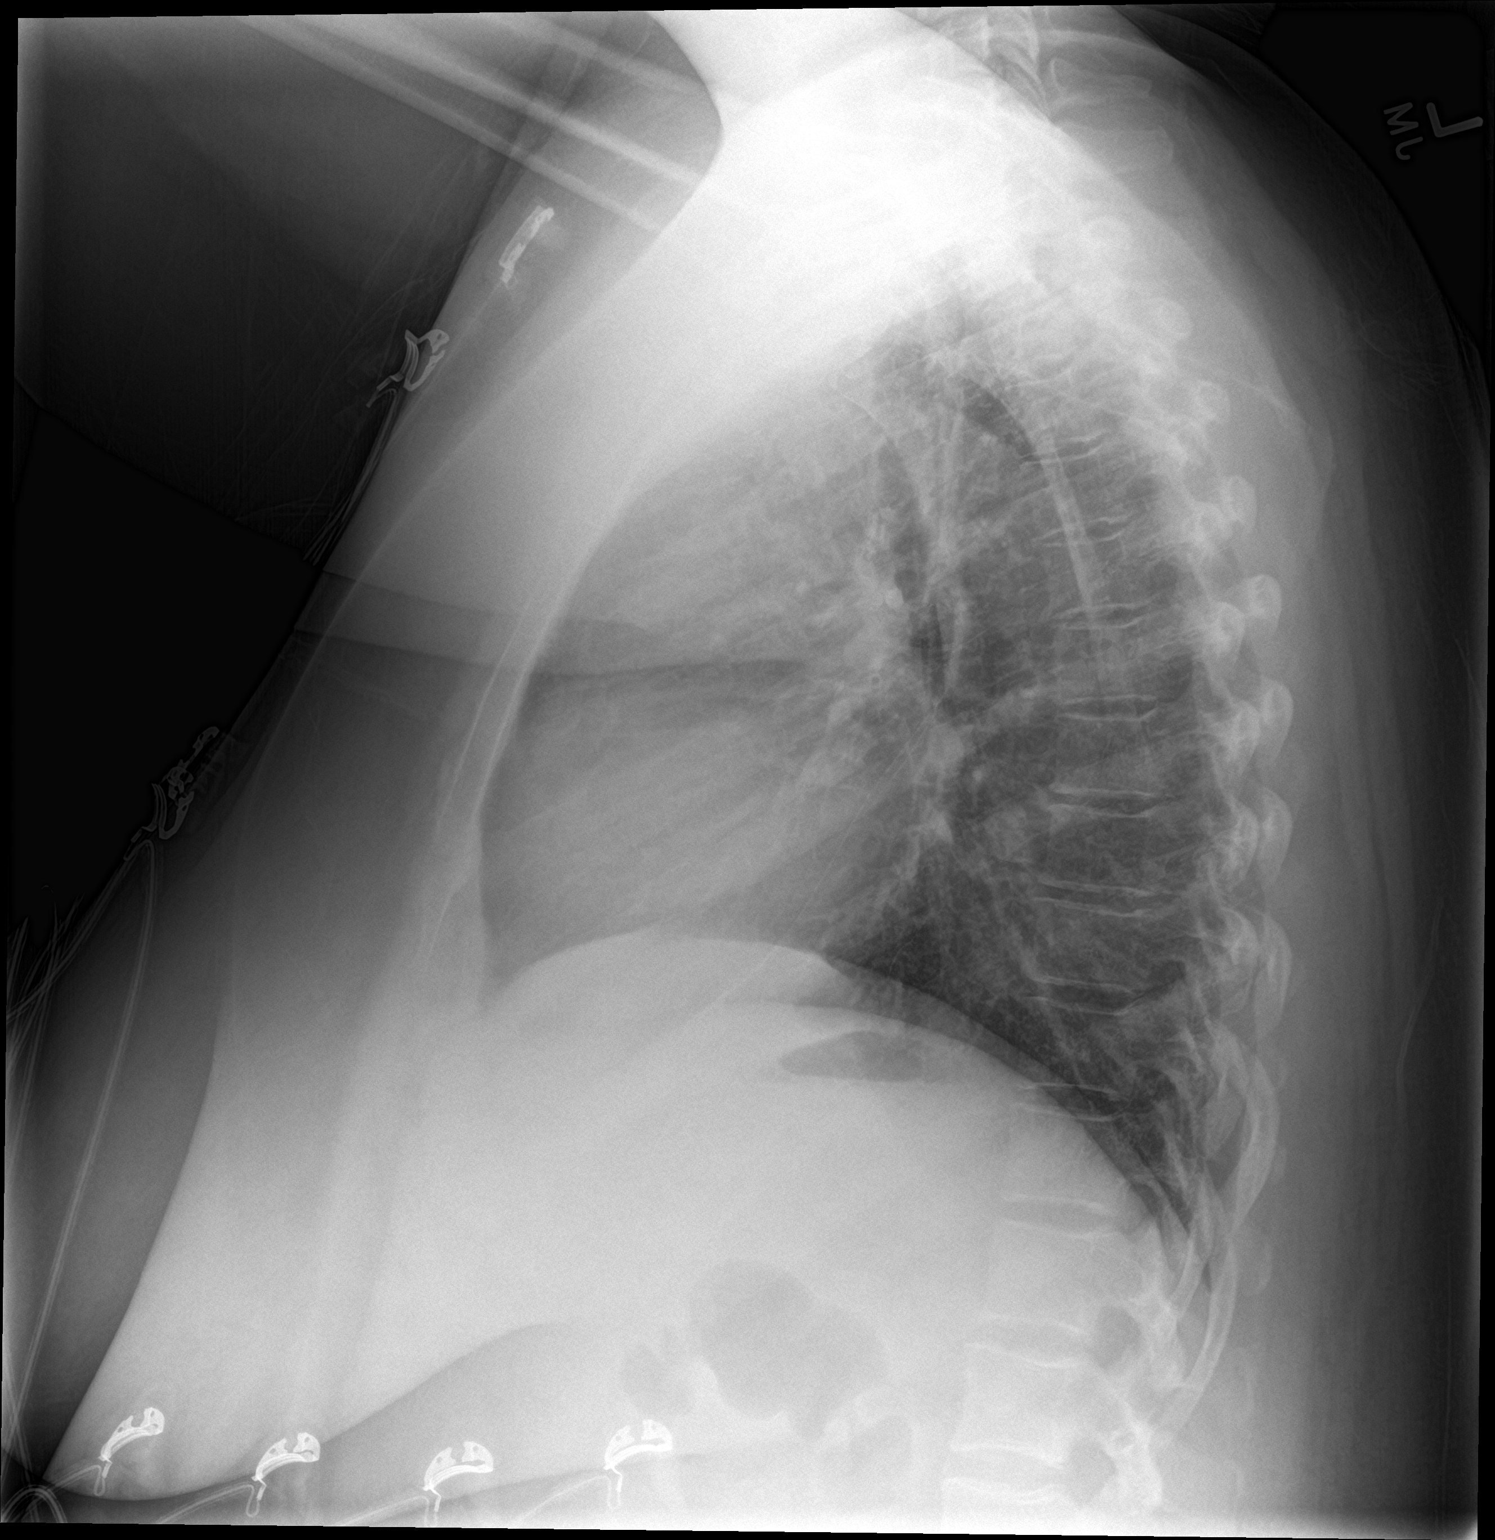

[2 of 2 positions shown; findings below may reference images not displayed]

FINDINGS: The heart size and mediastinal contours are within normal limits.
Both lungs are clear. The visualized skeletal structures are
unremarkable.
IMPRESSION: No active cardiopulmonary disease.

## 2017-02-23 ENCOUNTER — Telehealth: Payer: Self-pay | Admitting: *Deleted

## 2017-02-23 NOTE — Telephone Encounter (Signed)
Received fax from [place] for pt's diabetic testing supplies.  Pt denies requesting supplies from this company and denial was faxed to them at [phone]. Pt has not been seen since 03/2015. Spoke with pt and she states she is still planning to see [place] for primary care and is without insurance until after January 1st but BP and BS has been elevated; had recent ER visit for both. Scheduled pt appt with PCP on Monday at 4:40pm. Notified pt of amount to pay up front for OV and that any labs / procedures will be billed after the visit. Pt in agreement.

## 2017-02-26 ENCOUNTER — Ambulatory Visit: Payer: Commercial Managed Care - HMO | Admitting: Family

## 2017-06-11 ENCOUNTER — Telehealth: Payer: Self-pay | Admitting: *Deleted

## 2017-06-11 NOTE — Telephone Encounter (Signed)
Received Discharge Notice from [facility] 04/01

## 2017-06-17 ENCOUNTER — Telehealth: Payer: Self-pay | Admitting: Family

## 2017-06-17 ENCOUNTER — Encounter: Payer: Self-pay | Admitting: Family

## 2017-06-17 DIAGNOSIS — I214 Non-ST elevation (NSTEMI) myocardial infarction: Secondary | ICD-10-CM

## 2017-06-17 HISTORY — DX: Non-ST elevation (NSTEMI) myocardial infarction: I21.4

## 2017-06-17 NOTE — Telephone Encounter (Signed)
Please contact pt to arrange hospital follow up. 

## 2017-06-18 NOTE — Telephone Encounter (Signed)
Called pt and LVM advising them to call back and schedule a hospital f/u with pcp

## 2018-06-14 ENCOUNTER — Telehealth: Payer: Self-pay | Admitting: *Deleted

## 2018-06-14 NOTE — Telephone Encounter (Signed)
Left message on machine to see if patient would like to schedule a virtual visit, have moved, or have new pcp.

## 2019-01-05 ENCOUNTER — Other Ambulatory Visit: Payer: Self-pay

## 2019-01-05 ENCOUNTER — Emergency Department
Admission: EM | Admit: 2019-01-05 | Discharge: 2019-01-05 | Disposition: A | Discharge status: Home / Self Care | Payer: Self-pay | Attending: Emergency Medicine | Admitting: Emergency Medicine

## 2019-01-05 DIAGNOSIS — E119 Type 2 diabetes mellitus without complications: Secondary | ICD-10-CM | POA: Insufficient documentation

## 2019-01-05 DIAGNOSIS — Z794 Long term (current) use of insulin: Secondary | ICD-10-CM | POA: Insufficient documentation

## 2019-01-05 DIAGNOSIS — I252 Old myocardial infarction: Secondary | ICD-10-CM | POA: Insufficient documentation

## 2019-01-05 DIAGNOSIS — J4541 Moderate persistent asthma with (acute) exacerbation: Secondary | ICD-10-CM

## 2019-01-05 DIAGNOSIS — U071 COVID-19: Secondary | ICD-10-CM | POA: Insufficient documentation

## 2019-01-05 DIAGNOSIS — I10 Essential (primary) hypertension: Secondary | ICD-10-CM | POA: Insufficient documentation

## 2019-01-05 DIAGNOSIS — Z79899 Other long term (current) drug therapy: Secondary | ICD-10-CM | POA: Insufficient documentation

## 2019-01-05 MED ORDER — ALBUTEROL SULFATE HFA 108 (90 BASE) MCG/ACT IN AERS: 2.0000 | INHALATION_SPRAY | RESPIRATORY_TRACT | 0 refills | Status: AC | PRN

## 2019-01-05 MED ORDER — IPRATROPIUM-ALBUTEROL 0.5-2.5 (3) MG/3ML IN SOLN
3.0000 mL | Freq: Once | RESPIRATORY_TRACT | Status: AC
  Administered 2019-01-05: 3 mL via RESPIRATORY_TRACT
  Filled 2019-01-05: qty 3

## 2019-01-05 MED ORDER — PREDNISONE 20 MG PO TABS
60.0000 mg | ORAL_TABLET | Freq: Once | ORAL | Status: AC
  Administered 2019-01-05: 60 mg via ORAL
  Filled 2019-01-05: qty 3

## 2019-01-05 MED ORDER — PREDNISONE 10 MG PO TABS: 50.0000 mg | ORAL_TABLET | Freq: Every day | ORAL | 0 refills | Status: AC

## 2019-01-05 NOTE — ED Notes (Signed)
Patient reports  Productive cough and asthma attack beginning yesterday. Patient reports her ventolin inhaler is not helping.   Patient reports coughing worse when laying down. Patient reports productive yellow sputum.

## 2019-01-05 NOTE — ED Triage Notes (Signed)
Pt states she has a history of asthma and takes proventil. Pt states she has had a cough and increased use of inhaler. Pt appears in no acute distress. Talking in full sentences. Pt denies fever.

## 2019-01-05 NOTE — ED Provider Notes (Signed)
[facility] Emergency Department Provider Note  ____________________________________________  Time seen: Approximately 9:49 PM  I have reviewed the triage vital signs and the nursing notes.   HISTORY  Chief Complaint Cough   HPI Margaret Meyers is a 48 y.o. female presents to the emergency department for treatment and evaluation of a persistent asthma exacerbation..  She has had to use her albuterol more often over the past 24 hours and does not feel that she is getting adequate relief.  She continues to have wheezing despite using the inhaler.   She also has and intermittent cough that is not productive.  She has no fever, sore throat, nausea, vomiting, diarrhea, or other COVID-19 symptoms.  She would like to be screened however because she lives with a person with a new diagnosis of cancer.   Past Medical History:  Diagnosis Date  . Asthma   . Diabetes mellitus without complication ([place])   . Heart murmur   . Hypertension   . Migraine   . NSTEMI (non-ST elevated myocardial infarction) ([place]) 06/17/2017   4/19  . Obesity     Patient Active Problem List   Diagnosis Date Noted  . NSTEMI (non-ST elevated myocardial infarction) ([place]) 06/17/2017  . Angioedema 04/09/2015  . Headache 01/04/2015  . HTN (hypertension), malignant 01/04/2015  . DM (diabetes mellitus) ([place]) 01/04/2015  . Obesity 01/04/2015    Past Surgical History:  Procedure Laterality Date  . ABDOMINAL HYSTERECTOMY  2009  . ABSCESS DRAINAGE  2009   after hysterectomy  . D&C  1992  . LEEP  1988  . TUBAL LIGATION      Prior to Admission medications   Medication Sig Start Date End Date Taking? Authorizing Provider  albuterol (VENTOLIN HFA) 108 (90 Base) MCG/ACT inhaler Inhale 2 puffs into the lungs every 4 (four) hours as needed for wheezing or shortness of breath. 01/05/19   Verdis Bassette B, FNP  amLODipine (NORVASC) 10 MG tablet Take 1 tablet (10 mg total) by mouth daily. 07/12/16    Debbrah Alar, NP  canagliflozin (INVOKANA) 100 MG TABS tablet Take 1 tablet (100 mg total) by mouth daily before breakfast. Patient not taking: Reported on 09/02/2015 02/15/15   Debbrah Alar, NP  carvedilol (COREG) 12.5 MG tablet Take 1 tablet (12.5 mg total) by mouth 2 (two) times daily with a meal. 07/12/16   Debbrah Alar, NP  chlorpheniramine-HYDROcodone (TUSSIONEX PENNKINETIC ER) 10-8 MG/5ML SUER Take 5 mLs by mouth 2 (two) times daily as needed for cough (and pain). Do not drive while taking this medication 03/09/16   Clayton Bibles, [place]  EPINEPHrine 0.3 mg/0.3 mL IJ SOAJ injection Inject 0.3 mLs (0.3 mg total) into the muscle once. Patient not taking: Reported on 09/02/2015 04/12/15   Debbrah Alar, NP  glyBURIDE (DIABETA) 5 MG tablet Take 1 tablet (5 mg total) by mouth daily with breakfast. 07/12/16   Debbrah Alar, NP  hydrochlorothiazide (HYDRODIURIL) 12.5 MG tablet Take 1 tablet (12.5 mg total) by mouth daily. 07/12/16   Debbrah Alar, NP  Insulin Glargine (BASAGLAR KWIKPEN) 100 UNIT/ML SOPN Inject 0.1 mLs (10 Units total) into the skin at bedtime. 09/02/15   Domenic Moras, PA-C  Insulin Pen Needle (NOVOFINE PLUS) 32G X 4 MM MISC Use as directed 09/02/15   Domenic Moras, PA-C  meclizine (ANTIVERT) 25 MG tablet Take 1 tablet (25 mg total) by mouth 3 (three) times daily as needed for dizziness. 09/02/15   Domenic Moras, PA-C  predniSONE (DELTASONE) 10 MG tablet Take 5  tablets (50 mg total) by mouth daily. 01/05/19   Klea Nall B, FNP  sitaGLIPtin (JANUVIA) 100 MG tablet Take 1 tablet (100 mg total) by mouth daily. Patient not taking: Reported on 09/02/2015 01/26/15   Debbrah Alar, NP  topiramate (TOPAMAX) 25 MG tablet Take 4 tablets (100 mg total) by mouth at bedtime. Start with 1 pill at bed and increase by 1 pill each week until full dose. Patient not taking: Reported on 09/02/2015 05/03/15   Melvenia Beam, MD  traMADol (ULTRAM) 50 MG tablet Take 1 tablet (50  mg total) by mouth every 6 (six) hours as needed. 02/15/16   Gloriann Loan, PA-C  beclomethasone (QVAR) 40 MCG/ACT inhaler Inhale 2 puffs into the lungs 2 (two) times daily. Patient not taking: Reported on 11/23/2014 01/08/13 01/04/15  Hyman Bible, PA-C    Allergies Lisinopril and Penicillins  Family History  Problem Relation Age of Onset  . Cancer Maternal Aunt        Breast  . Hypertension Maternal Grandmother   . Aneurysm Mother        brain x 4  . Diabetes Neg Hx   . Heart disease Neg Hx   . Hyperlipidemia Neg Hx   . Migraines Neg Hx     Social History Social History   Tobacco Use  . Smoking status: Never Smoker  . Smokeless tobacco: Never Used  Substance Use Topics  . Alcohol use: Yes    Comment: occasional (holidays)  . Drug use: No    Review of Systems Constitutional: Negative for fever/chills.  Normal appetite. ENT: Negative for sore throat. Cardiovascular: Denies chest pain. Respiratory: Negative for shortness of breath.  Positive for cough.  Positive for wheezing.  Gastrointestinal: Negative for nausea, no vomiting.  No diarrhea.  Musculoskeletal: Negative for body aches Skin: Negative for rash. Neurological: Negative for headaches ____________________________________________   PHYSICAL EXAM:  VITAL SIGNS: ED Triage Vitals  Enc Vitals Group     BP 01/05/19 1941 (!) 176/91     Pulse Rate 01/05/19 1941 70     Resp 01/05/19 1951 18     Temp 01/05/19 1941 99 F (37.2 C)     Temp Source 01/05/19 1941 Oral     SpO2 01/05/19 1941 98 %     Weight 01/05/19 1941 207 lb (93.9 kg)     Height 01/05/19 1941 4\' 8"  (1.422 m)     Head Circumference --      Peak Flow --      Pain Score 01/05/19 1951 0     Pain Loc --      Pain Edu? --      Excl. in [place]? --     Constitutional: Alert and oriented. Well appearing and in no acute distress. Eyes: Conjunctivae are normal. Ears: TM normal Nose: No sinus congestion noted; no rhinnorhea. Mouth/Throat: Mucous  membranes are moist.  Oropharynx normal. Tonsils flat. Uvula midline. Neck: No stridor.  Lymphatic: No cervical lymphadenopathy. Cardiovascular: Normal rate, regular rhythm. Good peripheral circulation. Respiratory: Respirations are even and unlabored.  No retractions. Expiratory wheezing noted throughout. Gastrointestinal: Soft and nontender.  Musculoskeletal: FROM x 4 extremities.  Neurologic:  Normal speech and language. Skin:  Skin is warm, dry and intact. No rash noted. Psychiatric: Mood and affect are normal. Speech and behavior are normal.  ____________________________________________   LABS (all labs ordered are listed, but only abnormal results are displayed)  Labs Reviewed  SARS CORONAVIRUS 2 (TAT 6-24 HRS)   ____________________________________________  EKG  Not indicated. ____________________________________________  RADIOLOGY  Not indicated. ____________________________________________   PROCEDURES  Procedure(s) performed: None  Critical Care performed: No ____________________________________________   INITIAL IMPRESSION / ASSESSMENT AND PLAN / ED COURSE  48 y.o. female who presents to the emergency department for treatment and evaluation of asthma that has not responded to her usual treatment.    Duoneb and prednisone given in ER with significant relief. Patient states that she feels much better. She will be discharged home with a 5 day burst dose of steroid and refill of her albuterol. She is to follow up with her PCP or return to the ER for symptoms of concern.   Medications  ipratropium-albuterol (DUONEB) 0.5-2.5 (3) MG/3ML nebulizer solution 3 mL (3 mLs Nebulization Given 01/05/19 2204)  predniSONE (DELTASONE) tablet 60 mg (60 mg Oral Given 01/05/19 2204)    ED Discharge Orders         Ordered    predniSONE (DELTASONE) 10 MG tablet  Daily     01/05/19 2244    albuterol (VENTOLIN HFA) 108 (90 Base) MCG/ACT inhaler  Every 4 hours PRN      01/05/19 2244           Pertinent labs & imaging results that were available during my care of the patient were reviewed by me and considered in my medical decision making (see chart for details).    If controlled substance prescribed during this visit, 12 month history viewed on the [place] prior to issuing an initial prescription for Schedule II or III opiod. ____________________________________________   FINAL CLINICAL IMPRESSION(S) / ED DIAGNOSES  Final diagnoses:  Moderate persistent asthma with exacerbation    Note:  This document was prepared using Dragon voice recognition software and may include unintentional dictation errors.    Victorino Dike, FNP 01/05/19 2245    Lavonia Drafts, MD 01/05/19 2251

## 2019-01-06 ENCOUNTER — Telehealth: Payer: Self-pay | Admitting: Emergency Medicine

## 2019-01-06 LAB — SARS CORONAVIRUS 2 (TAT 6-24 HRS): SARS Coronavirus 2: POSITIVE — AB

## 2019-01-06 NOTE — Telephone Encounter (Signed)
Called patient and informed of positive covid, cdc guidelines for quarantine and contact quarantine.
